# Patient Record
Sex: Male | Born: 1965
Health system: Southern US, Community
[De-identification: ages and names within clinical notes are randomized; demographics above are authoritative.]

## PROBLEM LIST (undated history)

## (undated) DIAGNOSIS — E1159 Type 2 diabetes mellitus with other circulatory complications: Secondary | ICD-10-CM

## (undated) DIAGNOSIS — N200 Calculus of kidney: Secondary | ICD-10-CM

## (undated) DIAGNOSIS — I152 Hypertension secondary to endocrine disorders: Secondary | ICD-10-CM

## (undated) HISTORY — DX: Hypertension secondary to endocrine disorders: I15.2

## (undated) HISTORY — DX: Type 2 diabetes mellitus with other circulatory complications: E11.59

---

## 1996-04-22 HISTORY — PX: OTHER SURGICAL HISTORY: SHX169

## 2008-03-29 ENCOUNTER — Emergency Department (HOSPITAL_COMMUNITY): Admission: EM | Admit: 2008-03-29 | Discharge: 2008-03-29 | Payer: Self-pay | Admitting: Emergency Medicine

## 2008-03-31 ENCOUNTER — Emergency Department (HOSPITAL_COMMUNITY): Admission: EM | Admit: 2008-03-31 | Discharge: 2008-03-31 | Payer: Self-pay | Admitting: Emergency Medicine

## 2010-02-02 IMAGING — CT CT NECK W/ CM
1 series · 12 of 14 positions shown, 15 images · IV contrast (omnipaque)
Comparison: None available.

CLINICAL DATA: Swollen tongue.  Ludwig's angina.

CT NECK WITH CONTRAST
TECHNIQUE: Multidetector CT imaging of the neck was performed with
intravenous contrast.
Contrast: 100 ml Omnipaque 300

[Series 2: neck_routine 3.0 b40s st · axial · 0.39mm/px · z∈[-323,-71]mm · 12 of 100 slices shown, 15 images]
[im 8/100  soft-tissue]
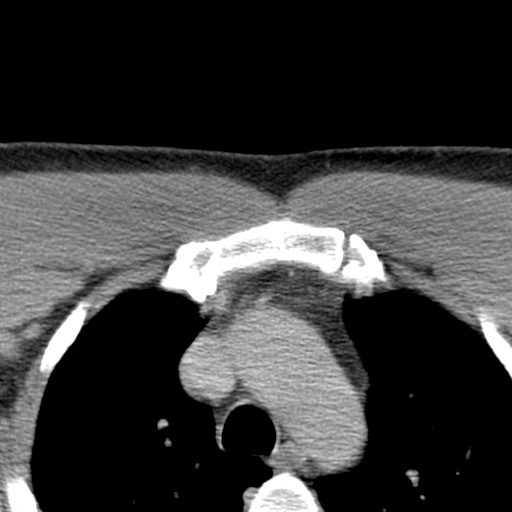
[im 8/100  bone]
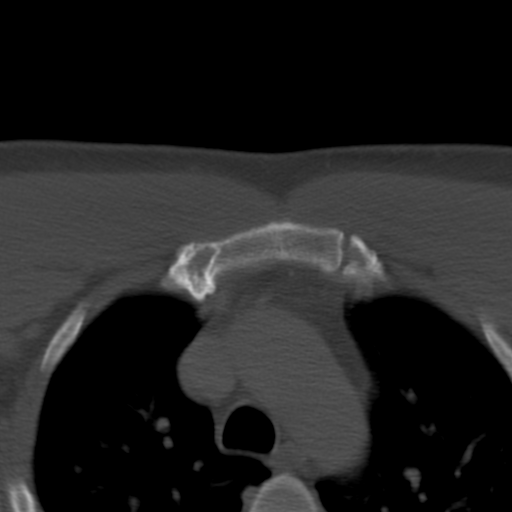
[im 16/100  bone]
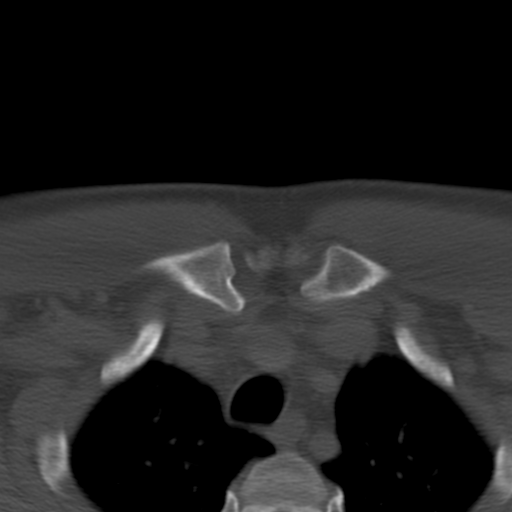
[im 23/100  bone]
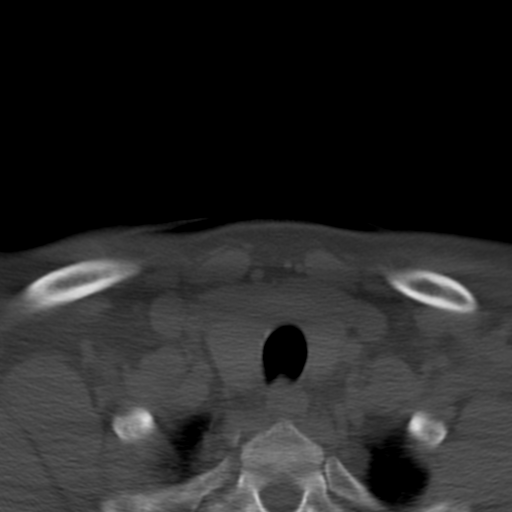
[im 31/100  bone]
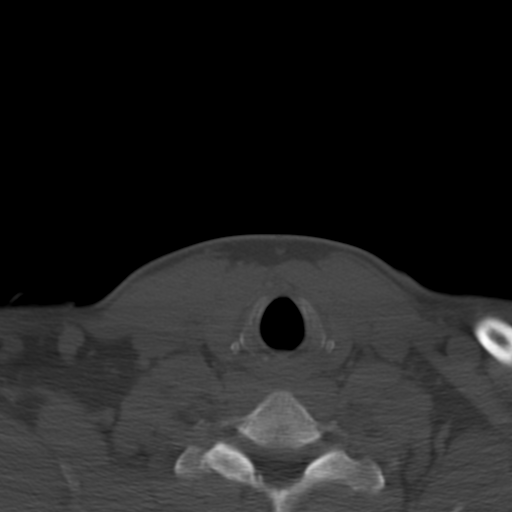
[im 39/100  soft-tissue]
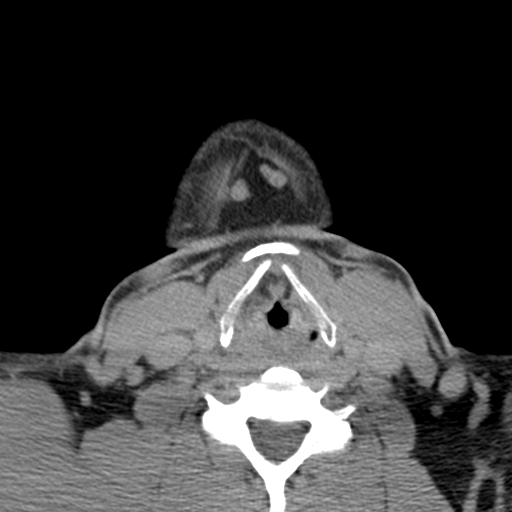
[im 39/100  bone]
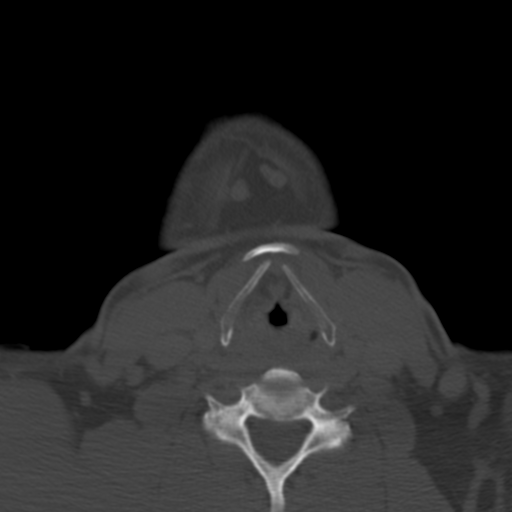
[im 46/100  bone]
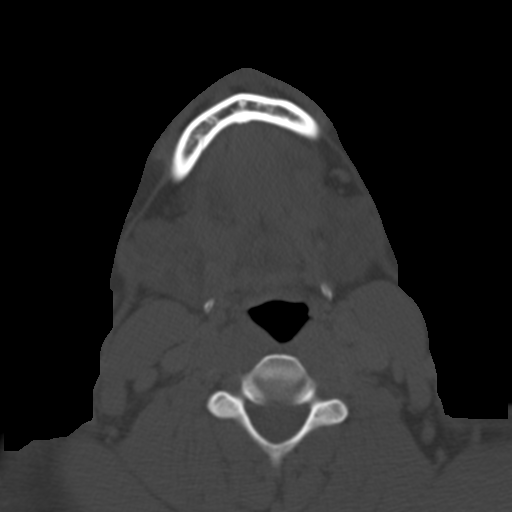
[im 54/100  bone]
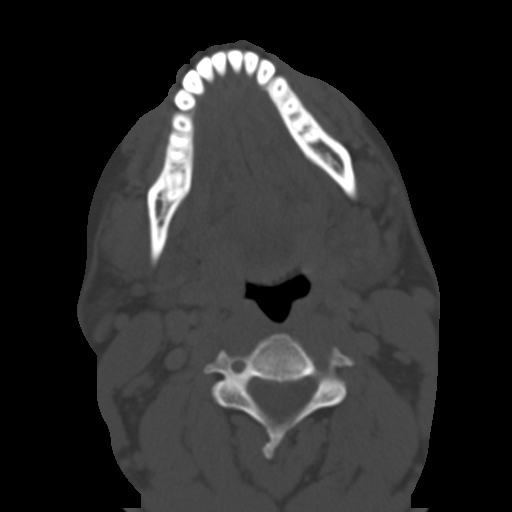
[im 61/100  bone]
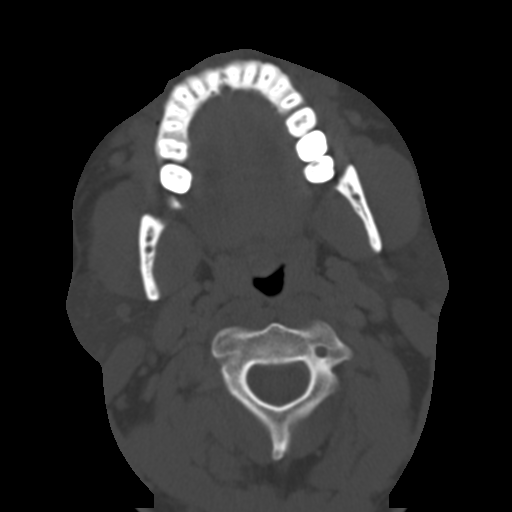
[im 69/100  soft-tissue]
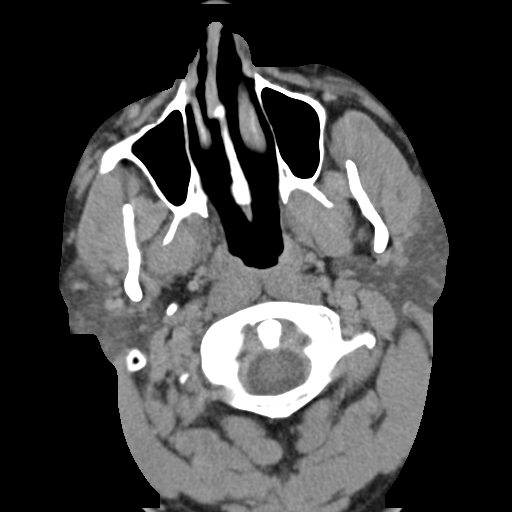
[im 69/100  bone]
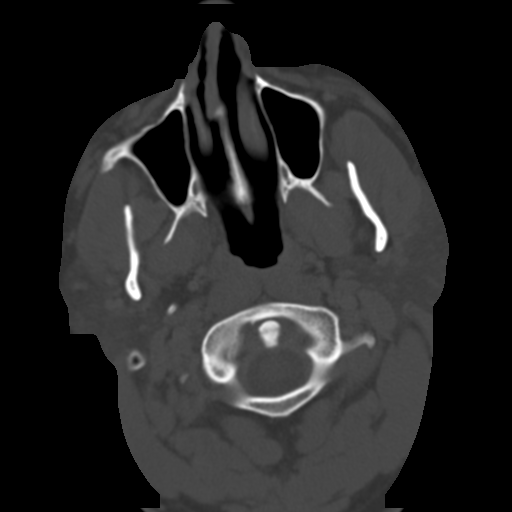
[im 77/100  bone]
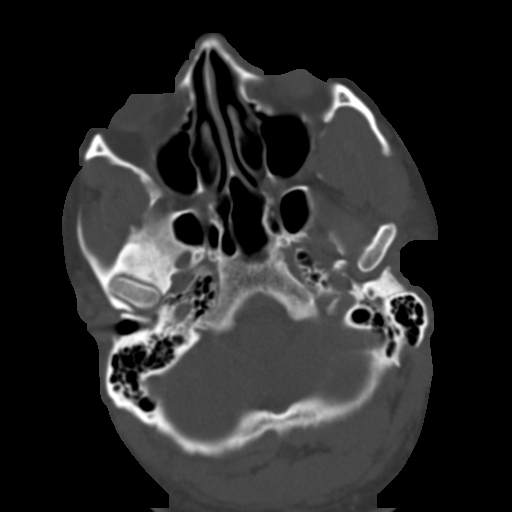
[im 84/100  bone]
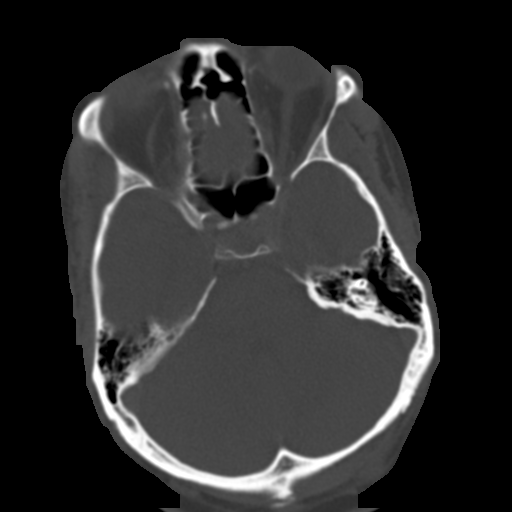
[im 92/100  bone]
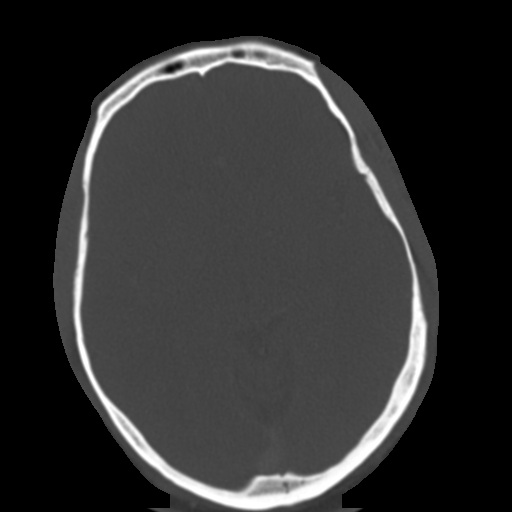

[12 of 14 positions shown; findings below may reference images not displayed]

FINDINGS: Limited imaging of the brain is unremarkable.  There is
no intrinsic tongue mass.  The contrast bolus is significantly
dilated due to the patient vomiting during the exam.  There is some
prominence of the geniohyoid muscles bilaterally.  Sub centimeter
submental nodes are present.  There are scattered bilateral sub
centimeter cervical nodes.  There is no pathologic enlargement.
The soft tissues are otherwise unremarkable.  The the visualized
paranasal sinuses and mastoid air cells are clear.  The osseous
structures are intact.  The lung apices are clear.
IMPRESSION: 1.  No intrinsic tongue mass.
2.  Mild prominence of the geniohyoid muscles may be inflammatory.
3.  Scattered small cervical lymph nodes bilaterally.

## 2010-09-04 NOTE — Consult Note (Signed)
NAME:  Alejandro Zimmerman, Alejandro Zimmerman                  ACCOUNT NO.:  [id number]   MEDICAL RECORD NO.:  [id number]          PATIENT TYPE:  EMS   LOCATION:  ED                           FACILITY:  [facility]   PHYSICIAN:  Kristine Garbe. Ezzard Standing, M.D.DATE OF BIRTH:  [birth year]   DATE OF CONSULTATION:  DATE OF DISCHARGE:  03/29/2008                                 CONSULTATION   REASON FOR CONSULTATION:  Evaluate the patient with floor of mouth  swelling, questionable Ludwig angina.   BRIEF HISTORY:  Alejandro Zimmerman is a 45 year old black male who has had some  swelling of the floor of the mouth, pain for the last 2 days.  Today, it  has gotten a little bit worse.  He had swelling of his tongue and  presented to [facility].  He was treated with a course  of IV antibiotics and steroids and got a CT scan.  The CT scan showed no  abscess and minimal swelling.  On examination, he has some swelling on  the floor of the mouth over the area of the submandibular duct, the  right side much worse than the left.  It is painful to palpation, it is  consistent with obstruction and inflammation of right submandibular  duct.  There is really no significant swelling over the gland,  submandibular gland itself.  There is no significant tongue swelling.  Airway is normal.   IMPRESSION:  Floor of mouth cellulitis, inflammation of right  submandibular duct.   RECOMMENDATIONS:  Recommend antibiotic, either Keflex or Augmentin for  the next week, will notify office if the symptoms worsen.           ______________________________  Kristine Garbe Ezzard Standing, M.D.     CEN/MEDQ  D:  03/29/2008  T:  03/30/2008  Job:  [id number]

## 2011-01-25 LAB — DIFFERENTIAL
Basophils Absolute: 0 10*3/uL (ref 0.0–0.1)
Eosinophils Relative: 1 % (ref 0–5)
Eosinophils Relative: 1 % (ref 0–5)
Lymphocytes Relative: 13 % (ref 12–46)
Lymphocytes Relative: 14 % (ref 12–46)
Lymphs Abs: 1.9 10*3/uL (ref 0.7–4.0)
Neutro Abs: 11.6 10*3/uL — ABNORMAL HIGH (ref 1.7–7.7)

## 2011-01-25 LAB — POCT I-STAT, CHEM 8
BUN: 8 mg/dL (ref 6–23)
Chloride: 101 mEq/L (ref 96–112)
Creatinine, Ser: 1.2 mg/dL (ref 0.4–1.5)
Sodium: 140 mEq/L (ref 135–145)
TCO2: 28 mmol/L (ref 0–100)

## 2011-01-25 LAB — CBC
HCT: 51.6 % (ref 39.0–52.0)
Hemoglobin: 17 g/dL (ref 13.0–17.0)
Platelets: 192 10*3/uL (ref 150–400)
Platelets: 203 10*3/uL (ref 150–400)
RDW: 13 % (ref 11.5–15.5)
WBC: 15.2 10*3/uL — ABNORMAL HIGH (ref 4.0–10.5)

## 2014-02-02 ENCOUNTER — Ambulatory Visit (INDEPENDENT_AMBULATORY_CARE_PROVIDER_SITE_OTHER): Payer: Self-pay | Admitting: Family Medicine

## 2014-02-02 VITALS — BP 138/84 | HR 70 | Temp 97.8°F | Resp 16 | Ht 72.5 in | Wt 261.0 lb

## 2014-02-02 DIAGNOSIS — E669 Obesity, unspecified: Secondary | ICD-10-CM

## 2014-02-02 DIAGNOSIS — Z0000 Encounter for general adult medical examination without abnormal findings: Secondary | ICD-10-CM | POA: Insufficient documentation

## 2014-02-02 DIAGNOSIS — Z024 Encounter for examination for driving license: Secondary | ICD-10-CM

## 2014-02-02 DIAGNOSIS — Z021 Encounter for pre-employment examination: Secondary | ICD-10-CM

## 2014-02-02 DIAGNOSIS — Z13220 Encounter for screening for lipoid disorders: Secondary | ICD-10-CM | POA: Insufficient documentation

## 2014-02-02 HISTORY — DX: Obesity, unspecified: E66.9

## 2014-02-02 NOTE — Progress Notes (Signed)
   Subjective:    Patient ID: Alejandro Zimmerman, male    DOB: 08/16/1965, 48 y.o.   MRN: [id number]  HPI  This 48 y.o. AA male is here for Self-pay DOT exam. He has no PCP but plans to get established with one within next 6 months. He has no hx of chronic medical problems. He considers himself to be in good health except for being overweight.  Surgical Hx: Hernia surgery.   Review of Systems  Constitutional: Negative.   HENT: Negative.   Eyes: Negative.        Wears corrective lenses.  Respiratory: Negative.   Cardiovascular: Negative.   Gastrointestinal: Negative.   Endocrine: Negative.   Genitourinary: Negative.   Musculoskeletal: Negative.   Skin: Negative.   Neurological: Negative.   Hematological: Negative.   Psychiatric/Behavioral: Negative.        Objective:   Physical Exam  Nursing note and vitals reviewed. Constitutional: He is oriented to person, place, and time. He appears well-developed and well-nourished. No distress.  HENT:  Head: Normocephalic and atraumatic.  Right Ear: Hearing, tympanic membrane, external ear and ear canal normal.  Left Ear: Hearing, tympanic membrane, external ear and ear canal normal.  Nose: Nose normal. No nasal deformity or septal deviation.  Mouth/Throat: Uvula is midline, oropharynx is clear and moist and mucous membranes are normal. No oral lesions. Normal dentition.  Eyes: Conjunctivae, EOM and lids are normal. Pupils are equal, round, and reactive to light. No scleral icterus.  Neck: Trachea normal, normal range of motion, full passive range of motion without pain and phonation normal. Neck supple. No spinous process tenderness and no muscular tenderness present. No mass and no thyromegaly present.  Cardiovascular: Normal rate, regular rhythm, S1 normal, S2 normal, normal heart sounds and normal pulses.   No extrasystoles are present. PMI is not displaced.  Exam reveals no gallop and no friction rub.   No murmur heard. Pulmonary/Chest:  Effort normal and breath sounds normal. No respiratory distress.  Abdominal: Soft. Normal appearance and bowel sounds are normal. He exhibits no distension, no pulsatile midline mass and no mass. There is no hepatosplenomegaly. There is no tenderness. There is no guarding and no CVA tenderness.  Musculoskeletal: Normal range of motion. He exhibits no edema and no tenderness.       Cervical back: Normal.       Thoracic back: Normal.       Lumbar back: Normal.  Remainder of exam unremarkable.  Lymphadenopathy:    He has no cervical adenopathy.  Neurological: He is alert and oriented to person, place, and time. He has normal strength and normal reflexes. He displays no atrophy. No cranial nerve deficit or sensory deficit. He exhibits normal muscle tone. He displays a negative Romberg sign. Coordination and gait normal.  Skin: Skin is warm and dry. He is not diaphoretic.  Psychiatric: He has a normal mood and affect. His behavior is normal. Judgment and thought content normal.       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  Advised to get established with PCP for ongoing healthcare maintenance.

## 2014-12-04 HISTORY — PX: OTHER SURGICAL HISTORY: SHX169

## 2014-12-12 ENCOUNTER — Encounter (HOSPITAL_COMMUNITY): Payer: Self-pay | Admitting: General Practice

## 2014-12-13 NOTE — Progress Notes (Signed)
Called Dr Cindee Salt office for orders for lithotripsy.

## 2014-12-15 ENCOUNTER — Ambulatory Visit (HOSPITAL_COMMUNITY): Admission: RE | Admit: 2014-12-15 | Payer: Self-pay | Source: Ambulatory Visit | Admitting: Urology

## 2014-12-15 HISTORY — DX: Calculus of kidney: N20.0

## 2014-12-15 SURGERY — LITHOTRIPSY, ESWL
Anesthesia: LOCAL | Laterality: Right

## 2016-01-25 ENCOUNTER — Ambulatory Visit (INDEPENDENT_AMBULATORY_CARE_PROVIDER_SITE_OTHER): Payer: Self-pay | Admitting: Physician Assistant

## 2016-01-25 VITALS — BP 134/84 | HR 74 | Temp 98.2°F | Resp 17 | Ht 72.0 in | Wt 250.0 lb

## 2016-01-25 DIAGNOSIS — Z0289 Encounter for other administrative examinations: Secondary | ICD-10-CM

## 2016-01-25 NOTE — Progress Notes (Signed)
Airline pilot Medical Examination   Alejandro Zimmerman is a 50 y.o. male who presents today for a commercial driver fitness determination physical exam. The patient reports no problems today. In the past the patient reports receiving 2 year certificates. He denies focal neurological deficits, vision and hearing changes. He denies the habitual use of benzodiazepines and chronic narcotic usage.   Patient has a history of kidney stones which he had surgery for last year. He states that he did not have any kidney stones prior.  Patient takes Flomax nightly and has a urologist who he regularly follows up with.  He denies any weakness with his hands/feet or issues with his grip strength. Patient does not take any medication for anxiety or depression.  He does not take any medications besides what is given to him.  He has been losing weight recently and drinks "protein shakes" every morning. Patient has had surgery for a hiatal hernia in the past.     Past Medical History:  Diagnosis Date  . Kidney stones     Current medications, family history, allergies, social history reviewed by me and exist elsewhere in the encounter.   Review of Systems  All other systems reviewed and are negative.   Objective:     Vision/hearing:  Visual Acuity Screening   Right eye Left eye Both eyes  Without correction:     With correction: 20/25 20/25 20/30   Hearing Screening Comments: Whisper test was 10 ft in both eyes.   Applicant can recognize and distinguish among traffic control signals and devices showing standard red, green, and amber colors.  Corrective lenses required: Yes  Monocular Vision?: No  Hearing aid requirement: No  Physical Exam  Constitutional: He is oriented to person, place, and time. He appears well-developed. No distress.  HENT:  Right Ear: Tympanic membrane normal.  Left Ear: Tympanic membrane normal.  Mouth/Throat: Oropharynx is clear and moist.  Neck:  No lymphadenopathy about  the head and neck.   Cardiovascular: Normal rate, regular rhythm and normal heart sounds.   No murmur heard. Pulmonary/Chest: Effort normal and breath sounds normal. No respiratory distress. He has no wheezes. He has no rales.  Lungs clear to auscultation bilaterally.   Abdominal: Soft. There is no tenderness.  Abdomen without masses.   Musculoskeletal: Normal range of motion. He exhibits no edema.  Neurological: He is oriented to person, place, and time. He displays normal reflexes. No cranial nerve deficit. He exhibits normal muscle tone. Coordination normal.  Reflexes 2+ globally.     BP 134/84   Pulse 74   Temp 98.2 F (36.8 C) (Oral)   Resp 17   Ht 6' (1.829 m)   Wt 250 lb (113.4 kg)   SpO2 98%   BMI 33.91 kg/m    Labs:  Specific gravity 1.030  He was positive for protein in his urine, negative for blood, negative for sugar.   Assessment:    Healthy male exam.  Meets standards in 66 CFR 391.41;  qualifies for 2 year certificate.  Of note he has protein in his urine, however this is not exclusionary criteria and he has a urologist with regular follow up given history of nephrolithiasis. Additionally, he has been taking a short course of tramadol for knee pain.  I have advised this medication is exclusionary and he wants to bring the remainder of his tabs to [facility] today and I will dispose of them.  He has no refills.    Plan:  Medical examiners certificate completed and printed. Return as needed.     This note was scribed in my presence and I performed the services described in the this documentation.

## 2016-01-25 NOTE — Patient Instructions (Signed)
     IF you received an x-ray today, you will receive an invoice from [facility]. Please contact [facility] at [phone] with questions or concerns regarding your invoice.   IF you received labwork today, you will receive an invoice from [facility]. Please contact [facility] at [phone] with questions or concerns regarding your invoice.   Our billing staff will not be able to assist you with questions regarding bills from these companies.  You will be contacted with the lab results as soon as they are available. The fastest way to get your results is to activate your My Chart account. Instructions are located on the last page of this paperwork. If you have not heard from us regarding the results in 2 weeks, please contact this office.      

## 2016-02-22 ENCOUNTER — Ambulatory Visit (INDEPENDENT_AMBULATORY_CARE_PROVIDER_SITE_OTHER): Payer: BLUE CROSS/BLUE SHIELD | Admitting: Physician Assistant

## 2016-02-22 VITALS — BP 140/96 | HR 87 | Temp 98.4°F | Resp 17 | Ht 72.0 in | Wt 250.0 lb

## 2016-02-22 DIAGNOSIS — H6690 Otitis media, unspecified, unspecified ear: Secondary | ICD-10-CM | POA: Diagnosis not present

## 2016-02-22 MED ORDER — AMOXICILLIN-POT CLAVULANATE 875-125 MG PO TABS: 1.0000 | ORAL_TABLET | Freq: Two times a day (BID) | ORAL | 0 refills | Status: DC

## 2016-02-22 NOTE — Progress Notes (Signed)
   Alejandro AlbeeRandy L Zimmerman  MRN: [id number] DOB: 12/09/1965  PCP: No PCP Per Patient  Subjective:  Pt is a 50 year old male who presents to clinic for illness x five days.  His symptoms began with headaches, and progressed to runny nose and watery eyes. Headache has continued with pressure at the front of his head, also complains of dizziness and feeling off balance.  Notes he doesn't feel like doing much, sleeps most of the day.  Tried netti pot, helped some. Tried allergy pills, helped some.  Drinks a gallon of water a day.  Has not had flu shot.   Review of Systems  Constitutional: Positive for fatigue. Negative for activity change, chills, diaphoresis and fever.  HENT: Positive for congestion, rhinorrhea and sneezing. Negative for sore throat and tinnitus.   Respiratory: Positive for cough. Negative for chest tightness, shortness of breath and wheezing.   Cardiovascular: Negative for chest pain and palpitations.  Gastrointestinal: Negative for abdominal pain, diarrhea, nausea and vomiting.  Neurological: Positive for dizziness and headaches. Negative for syncope, weakness and light-headedness.    Patient Active Problem List   Diagnosis Date Noted  . Obesity 02/02/2014    Current Outpatient Prescriptions on File Prior to Visit  Medication Sig Dispense Refill  . Multiple Vitamins-Minerals (MULTIVITAMIN WITH MINERALS) tablet Take 1 tablet by mouth daily.    . tamsulosin (FLOMAX) 0.4 MG CAPS capsule Take 0.4 mg by mouth daily after supper.     No current facility-administered medications on file prior to visit.     Allergies  Allergen Reactions  . Ibuprofen Other (See Comments)    Makes kidneys hurt.    Objective:  BP (!) 140/96 (BP Location: Left Arm, Patient Position: Sitting, Cuff Size: Large)   Pulse 87   Temp 98.4 F (36.9 C) (Oral)   Resp 17   Ht 6' (1.829 m)   Wt 250 lb (113.4 kg)   SpO2 98%   BMI 33.91 kg/m   Physical Exam  Constitutional: He is oriented to person,  place, and time and well-developed, well-nourished, and in no distress. No distress.  HENT:  Right Ear: Tympanic membrane is erythematous and bulging.  Left Ear: Tympanic membrane is bulging.  Mouth/Throat: Oropharynx is clear and moist and mucous membranes are normal.  Cardiovascular: Normal rate, regular rhythm and normal heart sounds.   Pulmonary/Chest: Effort normal and breath sounds normal. No respiratory distress.  Neurological: He is alert and oriented to person, place, and time. GCS score is 15.  Skin: Skin is warm and dry.  Psychiatric: Mood, memory, affect and judgment normal.  Vitals reviewed.   Assessment and Plan :  1. Acute otitis media, unspecified otitis media type - amoxicillin-clavulanate (AUGMENTIN) 875-125 MG tablet; Take 1 tablet by mouth 2 (two) times daily.  Dispense: 20 tablet; Refill: 0 - Supportive care discussed with patient. Get plenty of rest and drink fluids. RTC if symptoms do not improve/worsen in 5-7 days.    Marco CollieWhitney Zigmund Linse, PA-C  [facility] 02/22/2016 2:32 PM

## 2016-02-22 NOTE — Patient Instructions (Addendum)
Please fill this prescription at your pharmacy. You can take ibuprofen for pain/headache. Please continue drinking plenty of fluids and get plenty of rest.  Return to clinic if you are not better in 7-10 days.   Thank you for coming in today. I hope you feel we met your needs.  Feel free to call [facility] if you have any questions or further requests.  Please consider signing up for MyChart if you do not already have it, as this is a great way to communicate with me.  Best,  Whitney McVey, PA-C   IF you received an x-ray today, you will receive an invoice from [facility]. Please contact [facility] at [phone] with questions or concerns regarding your invoice.   IF you received labwork today, you will receive an invoice from [employer]. Please contact [facility] at [phone] with questions or concerns regarding your invoice.   Our billing staff will not be able to assist you with questions regarding bills from these companies.  You will be contacted with the lab results as soon as they are available. The fastest way to get your results is to activate your My Chart account. Instructions are located on the last page of this paperwork. If you have not heard from [place] regarding the results in 2 weeks, please contact this office.      Otitis Media, Adult Otitis media is redness, soreness, and inflammation of the middle ear. Otitis media may be caused by allergies or, most commonly, by infection. Often it occurs as a complication of the common cold. SIGNS AND SYMPTOMS Symptoms of otitis media may include:  Earache.  Fever.  Ringing in your ear.  Headache.  Leakage of fluid from the ear. DIAGNOSIS To diagnose otitis media, your health care provider will examine your ear with an otoscope. This is an instrument that allows your health care provider to see into your ear in order to examine your eardrum. Your health care provider also will ask you  questions about your symptoms. TREATMENT  Typically, otitis media resolves on its own within 3-5 days. Your health care provider may prescribe medicine to ease your symptoms of pain. If otitis media does not resolve within 5 days or is recurrent, your health care provider may prescribe antibiotic medicines if he or she suspects that a bacterial infection is the cause. HOME CARE INSTRUCTIONS   If you were prescribed an antibiotic medicine, finish it all even if you start to feel better.  Take medicines only as directed by your health care provider.  Keep all follow-up visits as directed by your health care provider. SEEK MEDICAL CARE IF:  You have otitis media only in one ear, or bleeding from your nose, or both.  You notice a lump on your neck.  You are not getting better in 3-5 days.  You feel worse instead of better. SEEK IMMEDIATE MEDICAL CARE IF:   You have pain that is not controlled with medicine.  You have swelling, redness, or pain around your ear or stiffness in your neck.  You notice that part of your face is paralyzed.  You notice that the bone behind your ear (mastoid) is tender when you touch it. MAKE SURE YOU:   Understand these instructions.  Will watch your condition.  Will get help right away if you are not doing well or get worse.   This information is not intended to replace advice given to you by your health care provider. Make sure you discuss  any questions you have with your health care provider.   Document Released: 01/12/2004 Document Revised: 04/29/2014 Document Reviewed: 11/03/2012 Elsevier Interactive Patient Education [employer].

## 2018-02-13 LAB — HM COLONOSCOPY

## 2020-03-06 ENCOUNTER — Other Ambulatory Visit: Payer: Self-pay

## 2020-03-06 ENCOUNTER — Ambulatory Visit: Payer: BC Managed Care – PPO | Admitting: Legal Medicine

## 2020-03-06 ENCOUNTER — Encounter: Payer: Self-pay | Admitting: Legal Medicine

## 2020-03-06 VITALS — BP 150/100 | HR 87 | Temp 98.0°F | Resp 16 | Ht 73.0 in | Wt 248.6 lb

## 2020-03-06 DIAGNOSIS — E1159 Type 2 diabetes mellitus with other circulatory complications: Secondary | ICD-10-CM | POA: Diagnosis not present

## 2020-03-06 DIAGNOSIS — R072 Precordial pain: Secondary | ICD-10-CM | POA: Diagnosis not present

## 2020-03-06 DIAGNOSIS — I152 Hypertension secondary to endocrine disorders: Secondary | ICD-10-CM | POA: Diagnosis not present

## 2020-03-06 DIAGNOSIS — R739 Hyperglycemia, unspecified: Secondary | ICD-10-CM

## 2020-03-06 DIAGNOSIS — Z13220 Encounter for screening for lipoid disorders: Secondary | ICD-10-CM | POA: Diagnosis not present

## 2020-03-06 DIAGNOSIS — R631 Polydipsia: Secondary | ICD-10-CM

## 2020-03-06 HISTORY — DX: Hyperglycemia, unspecified: R73.9

## 2020-03-06 LAB — POCT URINALYSIS DIP (CLINITEK)
Bilirubin, UA: NEGATIVE
Blood, UA: NEGATIVE
Glucose, UA: >=1000 mg/dL — AB
Leukocytes, UA: NEGATIVE
Nitrite, UA: NEGATIVE
Spec Grav, UA: 1.02 (ref 1.010–1.025)
Urobilinogen, UA: 0.2 E.U./dL
pH, UA: 6 (ref 5.0–8.0)

## 2020-03-06 LAB — POCT UA - MICROALBUMIN: Microalbumin Ur, POC: 80 mg/L

## 2020-03-06 LAB — GLUCOSE, POCT (MANUAL RESULT ENTRY): POC Glucose: 315 mg/dl — AB (ref 70–99)

## 2020-03-06 MED ORDER — LISINOPRIL 20 MG PO TABS: 20.0000 mg | ORAL_TABLET | Freq: Every day | ORAL | 3 refills | Status: DC

## 2020-03-06 MED ORDER — ATORVASTATIN CALCIUM 40 MG PO TABS: 40.0000 mg | ORAL_TABLET | Freq: Every day | ORAL | 3 refills | Status: DC

## 2020-03-06 MED ORDER — METFORMIN HCL 500 MG PO TABS: 500.0000 mg | ORAL_TABLET | Freq: Two times a day (BID) | ORAL | 3 refills | Status: DC

## 2020-03-06 NOTE — Patient Instructions (Signed)
Carpal Tunnel Syndrome  Carpal tunnel syndrome is a condition that causes pain in your hand and arm. The carpal tunnel is a narrow area that is on the palm side of your wrist. Repeated wrist motion or certain diseases may cause swelling in the tunnel. This swelling can pinch the main nerve in the wrist (median nerve). What are the causes? This condition may be caused by:  Repeated wrist motions.  Wrist injuries.  Arthritis.  A sac of fluid (cyst) or abnormal growth (tumor) in the carpal tunnel.  Fluid buildup during pregnancy. Sometimes the cause is not known. What increases the risk? The following factors may make you more likely to develop this condition:  Having a job in which you move your wrist in the same way many times. This includes jobs like being a butcher or a cashier.  Being a woman.  Having other health conditions, such as: ? Diabetes. ? Obesity. ? A thyroid gland that is not active enough (hypothyroidism). ? Kidney failure. What are the signs or symptoms? Symptoms of this condition include:  A tingling feeling in your fingers.  Tingling or a loss of feeling (numbness) in your hand.  Pain in your entire arm. This pain may get worse when you bend your wrist and elbow for a [place] time.  Pain in your wrist that goes up your arm to your shoulder.  Pain that goes down into your palm or fingers.  A weak feeling in your hands. You may find it hard to grab and hold items. You may feel worse at night. How is this diagnosed? This condition is diagnosed with a medical history and physical exam. You may also have tests, such as:  Electromyogram (EMG). This test checks the signals that the nerves send to the muscles.  Nerve conduction study. This test checks how well signals pass through your nerves.  Imaging tests, such as X-rays, ultrasound, and MRI. These tests check for what might be the cause of your condition. How is this treated? This condition may be treated  with:  Lifestyle changes. You will be asked to stop or change the activity that caused your problem.  Doing exercise and activities that make bones and muscles stronger (physical therapy).  Learning how to use your hand again (occupational therapy).  Medicines for pain and swelling (inflammation). You may have injections in your wrist.  A wrist splint.  Surgery. Follow these instructions at home: If you have a splint:  Wear the splint as told by your doctor. Remove it only as told by your doctor.  Loosen the splint if your fingers: ? Tingle. ? Lose feeling (become numb). ? Turn cold and blue.  Keep the splint clean.  If the splint is not waterproof: ? Do not let it get wet. ? Cover it with a watertight covering when you take a bath or a shower. Managing pain, stiffness, and swelling   If told, put ice on the painful area: ? If you have a removable splint, remove it as told by your doctor. ? Put ice in a plastic bag. ? Place a towel between your skin and the bag. ? Leave the ice on for 20 minutes, 2-3 times per day. General instructions  Take over-the-counter and prescription medicines only as told by your doctor.  Rest your wrist from any activity that may cause pain. If needed, talk with your boss at work about changes that can help your wrist heal.  Do any exercises as told by your doctor,   physical therapist, or occupational therapist.  Keep all follow-up visits as told by your doctor. This is important. Contact a doctor if:  You have new symptoms.  Medicine does not help your pain.  Your symptoms get worse. Get help right away if:  You have very bad numbness or tingling in your wrist or hand. Summary  Carpal tunnel syndrome is a condition that causes pain in your hand and arm.  It is often caused by repeated wrist motions.  Lifestyle changes and medicines are used to treat this problem. Surgery may help in very bad cases.  Follow your doctor's  instructions about wearing a splint, resting your wrist, keeping follow-up visits, and calling for help. This information is not intended to replace advice given to you by your health care provider. Make sure you discuss any questions you have with your health care provider. Document Revised: 08/15/2017 Document Reviewed: 08/15/2017 Elsevier Patient Education  2020 [employer] Inc[employer] Diabetes Basics  Diabetes (diabetes mellitus) is a [place]-term (chronic) disease. It occurs when the body does not properly use sugar (glucose) that is released from food after you eat. Diabetes may be caused by one or both of these problems:  Your pancreas does not make enough of a hormone called insulin.  Your body does not react in a normal way to insulin that it makes. Insulin lets sugars (glucose) go into cells in your body. This gives you energy. If you have diabetes, sugars cannot get into cells. This causes high blood sugar (hyperglycemia). Follow these instructions at home: How is diabetes treated? You may need to take insulin or other diabetes medicines daily to keep your blood sugar in balance. Take your diabetes medicines every day as told by your doctor. List your diabetes medicines here: Diabetes medicines  Name of medicine: ______________________________ ? Amount (dose): _______________ Time (a.m./p.m.): _______________ Notes: ___________________________________  Name of medicine: ______________________________ ? Amount (dose): _______________ Time (a.m./p.m.): _______________ Notes: ___________________________________  Name of medicine: ______________________________ ? Amount (dose): _______________ Time (a.m./p.m.): _______________ Notes: ___________________________________ If you use insulin, you will learn how to give yourself insulin by injection. You may need to adjust the amount based on the food that you eat. List the types of insulin you use here: Insulin  Insulin type:  ______________________________ ? Amount (dose): _______________ Time (a.m./p.m.): _______________ Notes: ___________________________________  Insulin type: ______________________________ ? Amount (dose): _______________ Time (a.m./p.m.): _______________ Notes: ___________________________________  Insulin type: ______________________________ ? Amount (dose): _______________ Time (a.m./p.m.): _______________ Notes: ___________________________________  Insulin type: ______________________________ ? Amount (dose): _______________ Time (a.m./p.m.): _______________ Notes: ___________________________________  Insulin type: ______________________________ ? Amount (dose): _______________ Time (a.m./p.m.): _______________ Notes: ___________________________________ How do I manage my blood sugar?  Check your blood sugar levels using a blood glucose monitor as directed by your doctor. Your doctor will set treatment goals for you. Generally, you should have these blood sugar levels:  Before meals (preprandial): 80-130 mg/dL (3.2-2.0 mmol/L).  After meals (postprandial): below 180 mg/dL (10 mmol/L).  A1c level: less than 7%. Write down the times that you will check your blood sugar levels: Blood sugar checks  Time: _______________ Notes: ___________________________________  Time: _______________ Notes: ___________________________________  Time: _______________ Notes: ___________________________________  Time: _______________ Notes: ___________________________________  Time: _______________ Notes: ___________________________________  Time: _______________ Notes: ___________________________________  What do I need to know about low blood sugar? Low blood sugar is called hypoglycemia. This is when blood sugar is at or below 70 mg/dL (3.9 mmol/L). Symptoms may include:  Feeling: ? Hungry. ? Worried or nervous (anxious). ? Sweaty  and clammy. ? Confused. ? Dizzy. ? Sleepy. ? Sick to your  stomach (nauseous).  Having: ? A fast heartbeat. ? A headache. ? A change in your vision. ? Tingling or no feeling (numbness) around the mouth, lips, or tongue. ? Jerky movements that you cannot control (seizure).  Having trouble with: ? Moving (coordination). ? Sleeping. ? Passing out (fainting). ? Getting upset easily (irritability). Treating low blood sugar To treat low blood sugar, eat or drink something sugary right away. If you can think clearly and swallow safely, follow the 15:15 rule:  Take 15 grams of a fast-acting carb (carbohydrate). Talk with your doctor about how much you should take.  Some fast-acting carbs are: ? Sugar tablets (glucose pills). Take 3-4 glucose pills. ? 6-8 pieces of hard candy. ? 4-6 oz (120-150 mL) of fruit juice. ? 4-6 oz (120-150 mL) of regular (not diet) soda. ? 1 Tbsp (15 mL) honey or sugar.  Check your blood sugar 15 minutes after you take the carb.  If your blood sugar is still at or below 70 mg/dL (3.9 mmol/L), take 15 grams of a carb again.  If your blood sugar does not go above 70 mg/dL (3.9 mmol/L) after 3 tries, get help right away.  After your blood sugar goes back to normal, eat a meal or a snack within 1 hour. Treating very low blood sugar If your blood sugar is at or below 54 mg/dL (3 mmol/L), you have very low blood sugar (severe hypoglycemia). This is an emergency. Do not wait to see if the symptoms will go away. Get medical help right away. Call your local emergency services (911 in the [place] Do not drive yourself to the hospital. Questions to ask your health care provider  Do I need to meet with a diabetes educator?  What equipment will I need to care for myself at home?  What diabetes medicines do I need? When should I take them?  How often do I need to check my blood sugar?  What number can I call if I have questions?  When is my next doctor's visit?  Where can I find a support group for people with  diabetes? Where to find more information  American Diabetes Association: www.diabetes.org  American Association of Diabetes Educators: www.diabeteseducator.org/patient-resources Contact a doctor if:  Your blood sugar is at or above 240 mg/dL (96.7 mmol/L) for 2 days in a row.  You have been sick or have had a fever for 2 days or more, and you are not getting better.  You have any of these problems for more than 6 hours: ? You cannot eat or drink. ? You feel sick to your stomach (nauseous). ? You throw up (vomit). ? You have watery poop (diarrhea). Get help right away if:  Your blood sugar is lower than 54 mg/dL (3 mmol/L).  You get confused.  You have trouble: ? Thinking clearly. ? Breathing. Summary  Diabetes (diabetes mellitus) is a [place]-term (chronic) disease. It occurs when the body does not properly use sugar (glucose) that is released from food after digestion.  Take insulin and diabetes medicines as told.  Check your blood sugar every day, as often as told.  Keep all follow-up visits as told by your doctor. This is important. This information is not intended to replace advice given to you by your health care provider. Make sure you discuss any questions you have with your health care provider. Document Revised: 12/30/2018 Document Reviewed: 07/11/2017 Elsevier Patient Education  2020 Elsevier Inc.  

## 2020-03-06 NOTE — Progress Notes (Signed)
Subjective:  Patient ID: Alejandro Zimmerman, male    DOB: 03-10-1966  Age: 54 y.o. MRN: [id number]  Chief Complaint  Patient presents with  . Numbness    bilateral hands and feet    HPI: Chronic visit  He is having numbness in arms and legs for years.  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  His sugar is high in urine.   Blurred vision, nocturia x 4 to 5 times.  No diet.  BP high on DOT.  Numbness hands and feet.  Hands itch.  Numbness feet especial first 2 toes.  B remaining elevated.  It was up with DOT.  Start lisinopril. Patient has probably had some diabetes with neuropathy and uncontrolled hypertension for several years now yet was on medicine for his blood pressure but he stopped that before.  He has a strong family history for adult onset diabetes.  He is not on a diet over nor does he exercise.  He is usually driving in his truck.  We discussed diet with him extensively as well as the probable diagnosis of diabetes with some high blood pressure.  We will recheck his blood pressure in 2 weeks. Current Outpatient Medications on File Prior to Visit  Medication Sig Dispense Refill  . Multiple Vitamins-Minerals (MULTIVITAMIN WITH MINERALS) tablet Take 1 tablet by mouth daily.    . tamsulosin (FLOMAX) 0.4 MG CAPS capsule Take 0.4 mg by mouth daily after supper. (Patient not taking: Reported on 03/06/2020)     No current facility-administered medications on file prior to visit.   Past Medical History:  Diagnosis Date  . Kidney stones    Past Surgical History:  Procedure Laterality Date  . hiatal hernia  1998  . left stent with stone removal Left 12/04/2014   [facility]    History reviewed. No pertinent family history. Social History   Socioeconomic History  . Marital status: Divorced    Spouse name: Not on file  . Number of children: Not on file  . Years of education: Not on file  . Highest education level: Not on file  Occupational History  . Not on file  Tobacco Use  .  Smoking status: Never Smoker  . Smokeless tobacco: Never Used  Substance and Sexual Activity  . Alcohol use: Yes    Comment: socially   . Drug use: Never  . Sexual activity: Yes  Other Topics Concern  . Not on file  Social History Narrative  . Not on file   Social Determinants of Health   Financial Resource Strain:   . Difficulty of Paying Living Expenses: Not on file  Food Insecurity:   . Worried About Programme researcher, broadcasting/film/video in the Last Year: Not on file  . Ran Out of Food in the Last Year: Not on file  Transportation Needs:   . Lack of Transportation (Medical): Not on file  . Lack of Transportation (Non-Medical): Not on file  Physical Activity:   . Days of Exercise per Week: Not on file  . Minutes of Exercise per Session: Not on file  Stress:   . Feeling of Stress : Not on file  Social Connections:   . Frequency of Communication with Friends and Family: Not on file  . Frequency of Social Gatherings with Friends and Family: Not on file  . Attends Religious Services: Not on file  . Active Member of Clubs or Organizations: Not on file  . Attends Banker Meetings: Not on  file  . Marital Status: Not on file    Review of Systems  Constitutional: Negative for activity change and appetite change.  HENT: Negative.   Eyes: Positive for visual disturbance.  Respiratory: Negative for cough, choking and shortness of breath.   Cardiovascular: Negative for chest pain, palpitations and leg swelling.  Endocrine: Positive for polydipsia, polyphagia and polyuria.  Genitourinary: Positive for frequency.  Neurological: Positive for numbness (hand and feet.).  Hematological: Negative.      Objective:  BP (!) 150/100 (BP Location: Right Arm, Patient Position: Sitting, Cuff Size: Normal)   Pulse 87   Temp 98 F (36.7 C) (Temporal)   Resp 16   Ht 6\' 1"  (1.854 m)   Wt 248 lb 9.6 oz (112.8 kg)   SpO2 95%   BMI 32.80 kg/m   BP/Weight 03/06/2020 02/22/2016 01/25/2016    Systolic BP 150 140 134  Diastolic BP 100 96 84  Wt. (Lbs) 248.6 250 250  BMI 32.8 33.91 33.91    Physical Exam Constitutional:      Appearance: He is obese.  HENT:     Head: Normocephalic and atraumatic.     Right Ear: Tympanic membrane, ear canal and external ear normal.     Left Ear: Tympanic membrane, ear canal and external ear normal.     Nose: Nose normal.     Mouth/Throat:     Mouth: Mucous membranes are moist.     Pharynx: Oropharynx is clear.  Eyes:     Extraocular Movements: Extraocular movements intact.     Conjunctiva/sclera: Conjunctivae normal.     Pupils: Pupils are equal, round, and reactive to light.  Cardiovascular:     Rate and Rhythm: Normal rate and regular rhythm.     Pulses: Normal pulses.     Heart sounds: Normal heart sounds.  Pulmonary:     Effort: Pulmonary effort is normal.     Breath sounds: Normal breath sounds.  Abdominal:     General: Abdomen is flat. Bowel sounds are normal.     Palpations: Abdomen is soft.  Skin:    General: Skin is warm.     Capillary Refill: Capillary refill takes less than 2 seconds.  Neurological:     General: No focal deficit present.     Mental Status: He is oriented to person, place, and time.     Sensory: Sensory deficit (hands and feet) present.  Psychiatric:        Mood and Affect: Mood normal.        Behavior: Behavior normal.        Thought Content: Thought content normal.     Diabetic Foot Exam - Simple   Simple Foot Form Diabetic Foot exam was performed with the following findings: Yes 03/06/2020  9:42 AM  Visual Inspection No deformities, no ulcerations, no other skin breakdown bilaterally: Yes Sensation Testing See comments: Yes Pulse Check Posterior Tibialis and Dorsalis pulse intact bilaterally: Yes Comments Decrease sensation first 2 toes    EKG: NSR rate83, borderline 1st degree sa block 03/08/2020, QRS , QTC 402, axis -24, LAD, Q's in inferior leads, may be septal.  Lab Results   Component Value Date   WBC 15.2 (H) 03/31/2008   HGB 18.4 (H) 03/31/2008   HCT 54.0 (H) 03/31/2008   PLT 203 03/31/2008   GLUCOSE 104 (H) 03/31/2008   NA 140 03/31/2008   K 3.5 03/31/2008   CL 101 03/31/2008   CREATININE 1.2 03/31/2008   BUN 8 03/31/2008  MICROALBUR 80 03/06/2020      Assessment & Plan:   1. Polydipsia - Glucose (CBG) Patient has had polydipsia and polyuria for at least last 3 months suggesting that his diabetes was worse during that time.  Urinalysis shows no infection.  2. Hyperglycemia - Hemoglobin A1c - metFORMIN (GLUCOPHAGE) 500 MG tablet; Take 1 tablet (500 mg total) by mouth 2 (two) times daily with a meal.  Dispense: 180 tablet; Refill: 3 - atorvastatin (LIPITOR) 40 MG tablet; Take 1 tablet (40 mg total) by mouth daily.  Dispense: 90 tablet; Refill: 3 Patient's been known to have hyperglycemia and was over 300 today.  Is also had glycosuria on his DOT physical.  We will check an A1c but will start him on Metformin 500 mg twice a day.  My nurse spent at least 20 minutes discussing diabetes and diabetic diet to start him out.  I also gave him a DASH diet to help him.  We discussed that this is only the beginning and we will have to continue to discuss these things with him to try to get him under better control.  3. Hypertension associated with diabetes (HCC) - CBC with Differential/Platelet - Comprehensive metabolic panel - POCT UA - Microalbumin - POCT URINALYSIS DIP (CLINITEK) - lisinopril (ZESTRIL) 20 MG tablet; Take 1 tablet (20 mg total) by mouth daily.  Dispense: 90 tablet; Refill: 3 - atorvastatin (LIPITOR) 40 MG tablet; Take 1 tablet (40 mg total) by mouth daily.  Dispense: 90 tablet; Refill: 3 An individual hypertension care plan was established and reinforced today.  The patient's status was assessed using clinical findings on exam and labs or diagnostic tests. The patient's success at meeting treatment goals on disease specific evidence-based  guidelines and found to be poor controlled. Started on lisinpril SELF MANAGEMENT: The patient and I together assessed ways to personally work towards obtaining the recommended goals. RECOMMENDATIONS: avoid decongestants found in common cold remedies, decrease consumption of alcohol, perform routine monitoring of BP with home BP cuff, exercise, reduction of dietary salt, take medicines as prescribed, try not to miss doses and quit smoking.  Regular exercise and maintaining a healthy weight is needed.  Stress reduction may help. A CLINICAL SUMMARY including written plan identify barriers to care unique to individual due to social or financial issues.  We attempt to mutually creat solutions for individual and family understanding.  4. Screening for hypercholesterolemia - Lipid panel - atorvastatin (LIPITOR) 40 MG tablet; Take 1 tablet (40 mg total) by mouth daily.  Dispense: 90 tablet; Refill: 3 Patient will be screened for hypercholesterolemia and will be started on Lipitor due to what appears to be diabetic.  5. Precordial pain - EKG 12-Lead Patient is having rather vague precordial pain and I have not found any chest wall tenderness.  He does not have shortness of breath sweats or radiation of the pain.  The EKG did suggest septal Q's as well as first-degree AV block.  We will will refer to cardiology.    Meds ordered this encounter  Medications  . metFORMIN (GLUCOPHAGE) 500 MG tablet    Sig: Take 1 tablet (500 mg total) by mouth 2 (two) times daily with a meal.    Dispense:  180 tablet    Refill:  3  . lisinopril (ZESTRIL) 20 MG tablet    Sig: Take 1 tablet (20 mg total) by mouth daily.    Dispense:  90 tablet    Refill:  3  . atorvastatin (LIPITOR) 40 MG  tablet    Sig: Take 1 tablet (40 mg total) by mouth daily.    Dispense:  90 tablet    Refill:  3    Orders Placed This Encounter  Procedures  . CBC with Differential/Platelet  . Comprehensive metabolic panel  . Hemoglobin A1c  .  Lipid panel  . Ambulatory referral to Cardiology  . Glucose (CBG)  . POCT UA - Microalbumin  . POCT URINALYSIS DIP (CLINITEK)  . EKG 12-Lead     I spent 40 minutes dedicated to the care of this patient on the date of this encounter to include face-to-face time with the patient, as well as: Preparing to see the patient (review of tests). Obtaining and/or reviewing separately obtained history. Performing a medically appropriate examination and/or evaluation. Counseling and educating the patient/family/caregiver. Ordering medications, tests, or procedures. Communicating with other health care professionals (not separately reported). Documenting clinical information in the electronic or other health record. Independently interpreting results (not separately reported) and communicating results to the patient/family/caregiver.  My nursing staff have aided in the documentation of this note on the behalf of Brent BullaLawrence Alverta Caccamo, MD,as directed by  Brent BullaLawrence Alfio Loescher, MD and thoroughly reviewed by Brent BullaLawrence Townsend Cudworth, MD.  Follow-up: Return in about 2 weeks (around 03/20/2020) for for BP and 3 months for diabetes.  An After Visit Summary was printed and given to the patient.  Brent BullaLawrence Dash Cardarelli, MD [facility] [phone]

## 2020-03-08 DIAGNOSIS — M9903 Segmental and somatic dysfunction of lumbar region: Secondary | ICD-10-CM | POA: Diagnosis not present

## 2020-03-08 DIAGNOSIS — M5451 Vertebrogenic low back pain: Secondary | ICD-10-CM | POA: Diagnosis not present

## 2020-03-08 DIAGNOSIS — M9905 Segmental and somatic dysfunction of pelvic region: Secondary | ICD-10-CM | POA: Diagnosis not present

## 2020-03-08 DIAGNOSIS — M9902 Segmental and somatic dysfunction of thoracic region: Secondary | ICD-10-CM | POA: Diagnosis not present

## 2020-03-09 ENCOUNTER — Other Ambulatory Visit: Payer: Self-pay

## 2020-03-09 MED ORDER — TAMSULOSIN HCL 0.4 MG PO CAPS: 0.4000 mg | ORAL_CAPSULE | Freq: Every day | ORAL | 1 refills | Status: DC

## 2020-03-10 DIAGNOSIS — M9905 Segmental and somatic dysfunction of pelvic region: Secondary | ICD-10-CM | POA: Diagnosis not present

## 2020-03-10 DIAGNOSIS — M9903 Segmental and somatic dysfunction of lumbar region: Secondary | ICD-10-CM | POA: Diagnosis not present

## 2020-03-10 DIAGNOSIS — M5451 Vertebrogenic low back pain: Secondary | ICD-10-CM | POA: Diagnosis not present

## 2020-03-10 DIAGNOSIS — M9902 Segmental and somatic dysfunction of thoracic region: Secondary | ICD-10-CM | POA: Diagnosis not present

## 2020-03-13 DIAGNOSIS — N200 Calculus of kidney: Secondary | ICD-10-CM | POA: Insufficient documentation

## 2020-03-13 DIAGNOSIS — I152 Hypertension secondary to endocrine disorders: Secondary | ICD-10-CM | POA: Insufficient documentation

## 2020-03-13 DIAGNOSIS — E1159 Type 2 diabetes mellitus with other circulatory complications: Secondary | ICD-10-CM | POA: Insufficient documentation

## 2020-03-14 ENCOUNTER — Ambulatory Visit: Payer: BC Managed Care – PPO | Admitting: Cardiology

## 2020-03-20 ENCOUNTER — Other Ambulatory Visit: Payer: Self-pay

## 2020-03-20 ENCOUNTER — Ambulatory Visit: Payer: BC Managed Care – PPO

## 2020-03-20 VITALS — BP 140/78 | HR 78

## 2020-03-20 DIAGNOSIS — I152 Hypertension secondary to endocrine disorders: Secondary | ICD-10-CM

## 2020-03-20 NOTE — Progress Notes (Signed)
Patient came today to recheck Blood pressure. His blood pressure was 140/78. He started lisinopril 20 mg daily since 03/06/2020. Last Blood pressure at the office was 150/100. Dr Marina Goodell recommended to continue with medicines and avoid salt and exercise. Follow up with him in 06/07/2020.

## 2020-04-04 ENCOUNTER — Ambulatory Visit: Payer: BC Managed Care – PPO

## 2020-04-19 ENCOUNTER — Encounter: Payer: Self-pay | Admitting: Legal Medicine

## 2020-05-01 ENCOUNTER — Other Ambulatory Visit: Payer: Self-pay

## 2020-05-01 DIAGNOSIS — Z13220 Encounter for screening for lipoid disorders: Secondary | ICD-10-CM

## 2020-05-01 DIAGNOSIS — E1159 Type 2 diabetes mellitus with other circulatory complications: Secondary | ICD-10-CM

## 2020-05-01 DIAGNOSIS — R739 Hyperglycemia, unspecified: Secondary | ICD-10-CM

## 2020-05-01 DIAGNOSIS — I152 Hypertension secondary to endocrine disorders: Secondary | ICD-10-CM

## 2020-05-01 MED ORDER — LISINOPRIL 20 MG PO TABS: 20.0000 mg | ORAL_TABLET | Freq: Every day | ORAL | 2 refills | Status: DC

## 2020-05-01 MED ORDER — METFORMIN HCL 500 MG PO TABS: 500.0000 mg | ORAL_TABLET | Freq: Two times a day (BID) | ORAL | 2 refills | Status: DC

## 2020-05-01 MED ORDER — ATORVASTATIN CALCIUM 40 MG PO TABS: 40.0000 mg | ORAL_TABLET | Freq: Every day | ORAL | 2 refills | Status: DC

## 2020-06-07 ENCOUNTER — Ambulatory Visit: Payer: Self-pay | Admitting: Legal Medicine

## 2020-10-11 ENCOUNTER — Other Ambulatory Visit: Payer: Self-pay | Admitting: Legal Medicine

## 2020-10-20 ENCOUNTER — Other Ambulatory Visit: Payer: Self-pay

## 2020-10-20 DIAGNOSIS — E1159 Type 2 diabetes mellitus with other circulatory complications: Secondary | ICD-10-CM

## 2020-11-14 ENCOUNTER — Ambulatory Visit: Payer: Self-pay | Admitting: Legal Medicine

## 2020-11-16 ENCOUNTER — Other Ambulatory Visit: Payer: Self-pay

## 2020-11-16 MED ORDER — TAMSULOSIN HCL 0.4 MG PO CAPS: ORAL_CAPSULE | ORAL | 1 refills | Status: DC

## 2020-11-16 NOTE — Telephone Encounter (Signed)
Needs change in pharmacy

## 2021-01-04 ENCOUNTER — Encounter: Payer: Self-pay | Admitting: Legal Medicine

## 2021-04-30 ENCOUNTER — Other Ambulatory Visit: Payer: Self-pay | Admitting: Legal Medicine

## 2021-04-30 DIAGNOSIS — Z13220 Encounter for screening for lipoid disorders: Secondary | ICD-10-CM

## 2021-04-30 DIAGNOSIS — R739 Hyperglycemia, unspecified: Secondary | ICD-10-CM

## 2021-04-30 DIAGNOSIS — I152 Hypertension secondary to endocrine disorders: Secondary | ICD-10-CM

## 2021-08-13 ENCOUNTER — Other Ambulatory Visit: Payer: Self-pay | Admitting: Legal Medicine

## 2021-08-14 ENCOUNTER — Ambulatory Visit (INDEPENDENT_AMBULATORY_CARE_PROVIDER_SITE_OTHER): Payer: Self-pay | Admitting: Legal Medicine

## 2021-08-14 ENCOUNTER — Encounter: Payer: Self-pay | Admitting: Legal Medicine

## 2021-08-14 VITALS — BP 140/82 | HR 80 | Resp 18 | Ht 73.0 in | Wt 243.8 lb

## 2021-08-14 DIAGNOSIS — I350 Nonrheumatic aortic (valve) stenosis: Secondary | ICD-10-CM | POA: Insufficient documentation

## 2021-08-14 DIAGNOSIS — I152 Hypertension secondary to endocrine disorders: Secondary | ICD-10-CM

## 2021-08-14 DIAGNOSIS — E1159 Type 2 diabetes mellitus with other circulatory complications: Secondary | ICD-10-CM

## 2021-08-14 DIAGNOSIS — R739 Hyperglycemia, unspecified: Secondary | ICD-10-CM

## 2021-08-14 DIAGNOSIS — J0100 Acute maxillary sinusitis, unspecified: Secondary | ICD-10-CM

## 2021-08-14 DIAGNOSIS — N400 Enlarged prostate without lower urinary tract symptoms: Secondary | ICD-10-CM

## 2021-08-14 MED ORDER — TAMSULOSIN HCL 0.4 MG PO CAPS: ORAL_CAPSULE | ORAL | 2 refills | Status: DC

## 2021-08-14 MED ORDER — METFORMIN HCL 500 MG PO TABS: 500.0000 mg | ORAL_TABLET | Freq: Two times a day (BID) | ORAL | 2 refills | Status: DC

## 2021-08-14 MED ORDER — AZITHROMYCIN 250 MG PO TABS: ORAL_TABLET | ORAL | 0 refills | Status: AC

## 2021-08-14 NOTE — Progress Notes (Addendum)
? ?Established Patient Office Visit ? ?Subjective   ?Patient ID: Alejandro Zimmerman, male    DOB: 07-29-1965  Age: 56 y.o. MRN: [id number] ? ?Chief Complaint  ?Patient presents with  ? Diabetes  ? Hypertension  ? ? ?HPI: chronic visit ? ?Patient present with type 2 diabetes.  Specifically, this is type 2, noninsulin requiring diabetes, complicated by hyperglycemia.  Compliance with treatment has been good; patient take medicines as directed, maintains diet and exercise regimen, follows up as directed, and is keeping glucose diary.  Date of  diagnosis 2010.  Depression screen has been performed.Tobacco screen nonsmoker. Current medicines for diabetes metformin.  Patient is on lisinopril for renal protection and atorvastatin for cholesterol control.  Patient performs foot exams daily and last ophthalmologic exam was needs eye exam.  ? ?Patient presents for follow up of hypertension.  Patient tolerating lisiorril well with side effects.  Patient was diagnosed with hypertension 2010 so has been treated for hypertension for 12 years.Patient is working on maintaining diet and exercise regimen and follows up as directed. Complication include none.  ? ?Patient presents with hyperlipidemia.  Compliance with treatment has been good; patient takes medicines as directed, maintains low cholesterol diet, follows up as directed, and maintains exercise regimen.  Patient is using atorvastatin without problems.  ?Patient changed pharmacies mid year now with [facility], he says he has been taking medicines regualrly. ? ?Review of Systems  ?Constitutional:  Negative for chills and fever.  ?HENT:  Negative for hearing loss and tinnitus.   ?Eyes:  Negative for redness.  ?Respiratory:  Negative for cough.   ?Cardiovascular:  Negative for chest pain and leg swelling.  ?Gastrointestinal:  Negative for heartburn.  ?Genitourinary:  Negative for dysuria.  ?Musculoskeletal:  Negative for myalgias and neck pain.  ?Skin:  Negative for rash.  ?Neurological:   Negative for dizziness and headaches.  ?Endo/Heme/Allergies:  Negative for polydipsia.  ?Psychiatric/Behavioral: Negative.    ? ?  ?Objective:  ?  ? ?BP (!) 154/82   Pulse 80   Resp 18   Ht 6\' 1"  (1.854 m)   Wt 243 lb 12.8 oz (110.6 kg)   SpO2 98%   BMI 32.17 kg/m?  ? ? ?Physical Exam ?Vitals reviewed.  ?Constitutional:   ?   General: He is not in acute distress. ?   Appearance: Normal appearance. He is obese.  ?HENT:  ?   Head: Normocephalic.  ?   Right Ear: Tympanic membrane normal.  ?   Left Ear: Tympanic membrane normal.  ?   Nose: Nose normal.  ?   Mouth/Throat:  ?   Mouth: Mucous membranes are moist.  ?Eyes:  ?   Extraocular Movements: Extraocular movements intact.  ?   Conjunctiva/sclera: Conjunctivae normal.  ?   Pupils: Pupils are equal, round, and reactive to light.  ?Cardiovascular:  ?   Rate and Rhythm: Normal rate and regular rhythm.  ?   Pulses: Normal pulses.  ?   Heart sounds: Murmur (as murmur) heard.  ?  No gallop.  ?Pulmonary:  ?   Effort: Pulmonary effort is normal. No respiratory distress.  ?   Breath sounds: Normal breath sounds. No wheezing.  ?Abdominal:  ?   General: Abdomen is flat. Bowel sounds are normal. There is no distension.  ?   Tenderness: There is no abdominal tenderness.  ?Musculoskeletal:     ?   General: Normal range of motion.  ?   Cervical back: Normal range of motion and neck  supple.  ?   Right lower leg: No edema.  ?   Left lower leg: No edema.  ?Skin: ?   General: Skin is warm.  ?   Capillary Refill: Capillary refill takes less than 2 seconds.  ?Neurological:  ?   General: No focal deficit present.  ?   Mental Status: He is alert and oriented to person, place, and time. Mental status is at baseline.  ?   Gait: Gait normal.  ?   Deep Tendon Reflexes: Reflexes normal.  ?Psychiatric:     ?   Mood and Affect: Mood normal.     ?   Thought Content: Thought content normal.  ? ? ? ? ? ? ? ?The ASCVD Risk score (Arnett DK, et al., 2019) failed to calculate for the following  reasons: ?  Cannot find a previous HDL lab ?  Cannot find a previous total cholesterol lab ? ?  ?Assessment & Plan:  ? ?Problem List Items Addressed This Visit   ? ?  ? Cardiovascular and Mediastinum  ? Hypertension associated with diabetes (HCC) - Primary  ? Relevant Medications  ? metFORMIN (GLUCOPHAGE) 500 MG tablet  ? Other Relevant Orders  ? Comprehensive metabolic panel  ? Hemoglobin A1c  ? Lipid panel  ? CBC with Differential/Platelet  ? Microalbumin / creatinine urine ratio ?An individual hypertension care plan was established and reinforced today.  The patient's status was assessed using clinical findings on exam and labs or diagnostic tests. The patient's success at meeting treatment goals on disease specific evidence-based guidelines and found to be fair controlled. ?SELF MANAGEMENT: The patient and I together assessed ways to personally work towards obtaining the recommended goals. ?RECOMMENDATIONS: avoid decongestants found in common cold remedies, decrease consumption of alcohol, perform routine monitoring of BP with home BP cuff, exercise, reduction of dietary salt, take medicines as prescribed, try not to miss doses and quit smoking.  Regular exercise and maintaining a healthy weight is needed.  Stress reduction may help. ?A CLINICAL SUMMARY including written plan identify barriers to care unique to individual due to social or financial issues.  We attempt to mutually creat solutions for individual and family understanding.  ? ?An individual care plan for diabetes was established and reinforced today.  The patient's status was assessed using clinical findings on exam, labs and diagnostic testing. Patient success at meeting goals based on disease specific evidence-based guidelines and found to be uncertain- no A1c controlled. ?Medications were assessed and patient's understanding of the medical issues , including barriers were assessed. ?Recommend adherence to a diabetic diet, a graduated exercise  program, HgbA1c level is checked quarterly, and urine microalbumin performed yearly .  Annual mono-filament sensation testing performed. Lower blood pressure and control hyperlipidemia is important. Get annual eye exams and annual flu shots and smoking cessation discussed.  Self management goals were discussed.  ?  ? Aortic stenosis ?New murmur heard at the base of the heart rating to both carotids he has good upstroke of the the systolic slope he has had no symptoms suggestive of aortic stenosis.  I discussed possible cardiology consultation he wants to wait at the present time since he has no symptoms and he has no insurance at this time.  ? ?Other Visit Diagnoses   ? ? Acute non-recurrent maxillary sinusitis      ? Relevant Medications  ? azithromycin (ZITHROMAX) 250 MG tablet ?Patient has sinusitis , gave mucinex DM samples ?  ? Benign prostatic hyperplasia without lower urinary  tract symptoms      ? Relevant Medications  ? tamsulosin (FLOMAX) 0.4 MG CAPS capsule ?AN INDIVIDUAL CARE PLAN for BPH was established and reinforced today.  The patient's status was assessed using clinical findings on exam, labs, and other diagnostic testing. Patient's success at meeting treatment goals based on disease specific evidence-bassed guidelines and found to be in good control. ?RECOMMENDATIONS include maintaining present medicines and treatment.  ?  ? Hyperglycemia      ? Relevant Medications  ? metFORMIN (GLUCOPHAGE) 500 MG tablet ?Related to diabetes control, DASH diet given and meal plan  ? ?  ? ? ?Return in about 6 months (around 02/13/2022) for fasting.  ? ? ?Brent BullaLawrence Marten Iles, MD ? ?

## 2021-08-14 NOTE — Patient Instructions (Signed)

## 2021-08-15 LAB — CBC WITH DIFFERENTIAL/PLATELET
Basophils Absolute: 0.1 10*3/uL (ref 0.0–0.2)
Basos: 1 %
EOS (ABSOLUTE): 0.3 10*3/uL (ref 0.0–0.4)
Eos: 4 %
Hematocrit: 49.2 % (ref 37.5–51.0)
Hemoglobin: 16.9 g/dL (ref 13.0–17.7)
Immature Grans (Abs): 0 10*3/uL (ref 0.0–0.1)
Immature Granulocytes: 0 %
Lymphocytes Absolute: 3.6 10*3/uL — ABNORMAL HIGH (ref 0.7–3.1)
Lymphs: 39 %
MCH: 29.9 pg (ref 26.6–33.0)
MCHC: 34.3 g/dL (ref 31.5–35.7)
MCV: 87 fL (ref 79–97)
Monocytes Absolute: 0.8 10*3/uL (ref 0.1–0.9)
Monocytes: 9 %
Neutrophils Absolute: 4.4 10*3/uL (ref 1.4–7.0)
Neutrophils: 47 %
Platelets: 199 10*3/uL (ref 150–450)
RBC: 5.66 x10E6/uL (ref 4.14–5.80)
RDW: 11.6 % (ref 11.6–15.4)
WBC: 9.1 10*3/uL (ref 3.4–10.8)

## 2021-08-15 LAB — LIPID PANEL
Chol/HDL Ratio: 2 ratio (ref 0.0–5.0)
Cholesterol, Total: 133 mg/dL (ref 100–199)
HDL: 67 mg/dL (ref >39)
LDL Chol Calc (NIH): 55 mg/dL (ref 0–99)
Triglycerides: 46 mg/dL (ref 0–149)
VLDL Cholesterol Cal: 11 mg/dL (ref 5–40)

## 2021-08-15 LAB — COMPREHENSIVE METABOLIC PANEL
ALT: 22 IU/L (ref 0–44)
AST: 20 IU/L (ref 0–40)
Albumin/Globulin Ratio: 1.5 (ref 1.2–2.2)
Albumin: 4.4 g/dL (ref 3.8–4.9)
Alkaline Phosphatase: 88 IU/L (ref 44–121)
BUN/Creatinine Ratio: 10 (ref 9–20)
BUN: 10 mg/dL (ref 6–24)
Bilirubin Total: 0.7 mg/dL (ref 0.0–1.2)
CO2: 22 mmol/L (ref 20–29)
Calcium: 9.2 mg/dL (ref 8.7–10.2)
Chloride: 103 mmol/L (ref 96–106)
Creatinine, Ser: 0.97 mg/dL (ref 0.76–1.27)
Globulin, Total: 3 g/dL (ref 1.5–4.5)
Glucose: 208 mg/dL — ABNORMAL HIGH (ref 70–99)
Potassium: 4.1 mmol/L (ref 3.5–5.2)
Sodium: 138 mmol/L (ref 134–144)
Total Protein: 7.4 g/dL (ref 6.0–8.5)
eGFR: 91 mL/min/{1.73_m2} (ref >59)

## 2021-08-15 LAB — MICROALBUMIN / CREATININE URINE RATIO
Creatinine, Urine: 180.8 mg/dL
Microalb/Creat Ratio: 26 mg/g creat (ref 0–29)
Microalbumin, Urine: 46.4 ug/mL

## 2021-08-15 LAB — HEMOGLOBIN A1C
Est. average glucose Bld gHb Est-mCnc: 255 mg/dL
Hgb A1c MFr Bld: 10.5 % — ABNORMAL HIGH (ref 4.8–5.6)

## 2021-08-15 LAB — CARDIOVASCULAR RISK ASSESSMENT

## 2021-08-15 NOTE — Progress Notes (Signed)
Glucose 208, kidney and liver tests normal, A1c 10.5 very high, hemoglobin and hematocrit elevated, cholesterol normal, Microalbumin normal, we need to see for better DM control and set up dietary counseling ?lp

## 2021-08-20 ENCOUNTER — Other Ambulatory Visit: Payer: Self-pay

## 2021-08-20 MED ORDER — METFORMIN HCL 1000 MG PO TABS: 1000.0000 mg | ORAL_TABLET | Freq: Two times a day (BID) | ORAL | 3 refills | Status: DC

## 2021-08-20 MED ORDER — PIOGLITAZONE HCL 15 MG PO TABS: 15.0000 mg | ORAL_TABLET | Freq: Every day | ORAL | 3 refills | Status: DC

## 2021-08-23 ENCOUNTER — Other Ambulatory Visit: Payer: Self-pay

## 2021-08-23 MED ORDER — PIOGLITAZONE HCL 15 MG PO TABS
15.0000 mg | ORAL_TABLET | Freq: Every day | ORAL | 1 refills | Status: DC
Start: 2021-08-23 — End: 2022-03-05

## 2021-09-18 ENCOUNTER — Other Ambulatory Visit: Payer: Self-pay

## 2021-11-06 NOTE — Progress Notes (Deleted)
Subjective:  Patient ID: Alejandro Zimmerman, male    DOB: June 08, 1965  Age: 56 y.o. MRN: [id number]  No chief complaint on file.   Diabetes Pertinent negatives for hypoglycemia include no dizziness or headaches. Pertinent negatives for diabetes include no chest pain and no fatigue.  Hyperlipidemia Pertinent negatives include no chest pain, myalgias or shortness of breath.  Hypertension Pertinent negatives include no chest pain, headaches or shortness of breath.       Patient present with type 2 diabetes.  Specifically, this is type 2, noninsulin requiring diabetes, complicated by hyperglycemia.  Compliance with treatment has been good; patient take medicines as directed, maintains diet and exercise regimen, follows up as directed, and is keeping glucose diary.  Date of  diagnosis 2010.  Depression screen has been performed.Tobacco screen nonsmoker. Current medicines for diabetes metformin.  Patient is on lisinopril for renal protection and atorvastatin for cholesterol control.  Patient performs foot exams daily and last ophthalmologic exam was needs eye exam.    Patient presents for follow up of hypertension.  Patient tolerating lisiorril well with side effects.  Patient was diagnosed with hypertension 2010 so has been treated for hypertension for 12 years.Patient is working on maintaining diet and exercise regimen and follows up as directed. Complication include none.    Patient presents with hyperlipidemia.  Compliance with treatment has been good; patient takes medicines as directed, maintains low cholesterol diet, follows up as directed, and maintains exercise regimen.  Patient is using atorvastatin without problems.  Patient changed pharmacies mid year now with [facility], he says he has been taking medicines regualrly.   Diabetes Mellitus Type II, follow-up  Lab Results  Component Value Date   HGBA1C 10.5 (H) 08/14/2021   Last seen for diabetes {1-12:18279} {days/wks/mos/yrs:310907} ago.   Management since then includes actos, metformin  He reports excellent compliance with treatment. He is not having side effects.   Home blood sugar records: {diabetes glucometry results:16657} Most Recent Eye Exam: ***  --------------------------------------------------------------------------------------------------- Hypertension, follow-up  BP Readings from Last 3 Encounters:  08/14/21 140/82  03/20/20 140/78  03/06/20 (!) 150/100   Wt Readings from Last 3 Encounters:  08/14/21 243 lb 12.8 oz (110.6 kg)  03/06/20 248 lb 9.6 oz (112.8 kg)  02/22/16 250 lb (113.4 kg)     BP at that visit was ***. Management since that visit includes lisinopril. He reports excellent compliance with treatment. He is not having side effects.  He {is/is not:9024} exercising. He {is/is not:9024} adherent to low salt diet.    He {does/does not:200015} smoke.  -------------------------------------------------------------------------------------------------- Lipid/Cholesterol, follow-up  Last Lipid Panel: Lab Results  Component Value Date   CHOL 133 08/14/2021   LDLCALC 55 08/14/2021   HDL 67 08/14/2021   TRIG 46 08/14/2021   Management since that visit includes lipitor.  He reports excellent compliance with treatment. He is not having side effects.   He is following a {diet:21022986} diet. Current exercise: {exercise types:16438}  Last metabolic panel Lab Results  Component Value Date   GLUCOSE 208 (H) 08/14/2021   NA 138 08/14/2021   K 4.1 08/14/2021   BUN 10 08/14/2021   CREATININE 0.97 08/14/2021   CALCIUM 9.2 08/14/2021   AST 20 08/14/2021   ALT 22 08/14/2021   The 10-year ASCVD risk score (Arnett DK, et al., 2019) is: 13.3%  ---------------------------------------------------------------------------------------------------  Current Outpatient Medications on File Prior to Visit  Medication Sig Dispense Refill   atorvastatin (LIPITOR) 40 MG tablet TAKE 1 TABLET(40 MG) BY  MOUTH DAILY 90 tablet 2   lisinopril (ZESTRIL) 20 MG tablet TAKE 1 TABLET(20 MG) BY MOUTH DAILY 90 tablet 2   metFORMIN (GLUCOPHAGE) 1000 MG tablet Take 1 tablet (1,000 mg total) by mouth 2 (two) times daily with a meal. 60 tablet 3   Multiple Vitamins-Minerals (MULTIVITAMIN WITH MINERALS) tablet Take 1 tablet by mouth daily.     pioglitazone (ACTOS) 15 MG tablet Take 1 tablet (15 mg total) by mouth daily. 90 tablet 1   tamsulosin (FLOMAX) 0.4 MG CAPS capsule TAKE 1 CAPSULE BY MOUTH ONCE DAILY AFTERSUPPER 90 capsule 2   No current facility-administered medications on file prior to visit.   Past Medical History:  Diagnosis Date   Hyperglycemia 03/06/2020   Hypertension associated with diabetes (HCC)    Kidney stones    Obesity 02/02/2014   Past Surgical History:  Procedure Laterality Date   hiatal hernia  1998   left stent with stone removal Left 12/04/2014   [facility]    Family History  Problem Relation Age of Onset   Prostate cancer Father    Social History   Socioeconomic History   Marital status: Divorced    Spouse name: Not on file   Number of children: 1   Years of education: Not on file   Highest education level: Not on file  Occupational History   Not on file  Tobacco Use   Smoking status: Never   Smokeless tobacco: Never  Substance and Sexual Activity   Alcohol use: Yes    Comment: socially    Drug use: Never   Sexual activity: Yes  Other Topics Concern   Not on file  Social History Narrative   Not on file   Social Determinants of Health   Financial Resource Strain: Not on file  Food Insecurity: Not on file  Transportation Needs: Not on file  Physical Activity: Not on file  Stress: Not on file  Social Connections: Not on file    Review of Systems  Constitutional:  Negative for chills, diaphoresis, fatigue and fever.  HENT:  Negative for congestion, ear pain and sore throat.   Respiratory:  Negative for cough and shortness of breath.    Cardiovascular:  Negative for chest pain and leg swelling.  Gastrointestinal:  Negative for abdominal pain, constipation, diarrhea, nausea and vomiting.  Genitourinary:  Negative for dysuria and urgency.  Musculoskeletal:  Negative for arthralgias and myalgias.  Neurological:  Negative for dizziness and headaches.  Psychiatric/Behavioral:  Negative for dysphoric mood.      Objective:  There were no vitals taken for this visit.     08/14/2021    1:54 PM 03/20/2020   11:24 AM 03/06/2020    8:38 AM  BP/Weight  Systolic BP 140 140 150  Diastolic BP 82 78 100  Wt. (Lbs) 243.8  248.6  BMI 32.17 kg/m2  32.8 kg/m2    Physical Exam Vitals reviewed.  Constitutional:      Appearance: Normal appearance. He is normal weight.  Cardiovascular:     Rate and Rhythm: Normal rate and regular rhythm.     Heart sounds: No murmur heard. Pulmonary:     Effort: Pulmonary effort is normal.     Breath sounds: Normal breath sounds.  Abdominal:     General: Abdomen is flat. Bowel sounds are normal.     Palpations: Abdomen is soft.     Tenderness: There is no abdominal tenderness.  Neurological:     Mental Status: He is alert and  oriented to person, place, and time.  Psychiatric:        Mood and Affect: Mood normal.        Behavior: Behavior normal.     Diabetic Foot Exam - Simple   No data filed      Lab Results  Component Value Date   WBC 9.1 08/14/2021   HGB 16.9 08/14/2021   HCT 49.2 08/14/2021   PLT 199 08/14/2021   GLUCOSE 208 (H) 08/14/2021   CHOL 133 08/14/2021   TRIG 46 08/14/2021   HDL 67 08/14/2021   LDLCALC 55 08/14/2021   ALT 22 08/14/2021   AST 20 08/14/2021   NA 138 08/14/2021   K 4.1 08/14/2021   CL 103 08/14/2021   CREATININE 0.97 08/14/2021   BUN 10 08/14/2021   CO2 22 08/14/2021   HGBA1C 10.5 (H) 08/14/2021   MICROALBUR 80 03/06/2020      Assessment & Plan:   Problem List Items Addressed This Visit   None .  No orders of the defined types were  placed in this encounter.   No orders of the defined types were placed in this encounter.    Follow-up: No follow-ups on file.  An After Visit Summary was printed and given to the patient.  Janie Morning, NP [facility] [phone]

## 2021-11-08 ENCOUNTER — Encounter: Payer: Self-pay | Admitting: Nurse Practitioner

## 2021-11-08 DIAGNOSIS — E1159 Type 2 diabetes mellitus with other circulatory complications: Secondary | ICD-10-CM

## 2021-12-18 NOTE — Progress Notes (Signed)
This encounter was created in error - please disregard.

## 2022-01-28 ENCOUNTER — Other Ambulatory Visit: Payer: Self-pay

## 2022-01-28 DIAGNOSIS — R739 Hyperglycemia, unspecified: Secondary | ICD-10-CM

## 2022-01-28 DIAGNOSIS — Z13220 Encounter for screening for lipoid disorders: Secondary | ICD-10-CM

## 2022-01-28 DIAGNOSIS — E1159 Type 2 diabetes mellitus with other circulatory complications: Secondary | ICD-10-CM

## 2022-01-28 MED ORDER — LISINOPRIL 20 MG PO TABS: 20.0000 mg | ORAL_TABLET | Freq: Every day | ORAL | 1 refills | Status: DC

## 2022-01-28 MED ORDER — ATORVASTATIN CALCIUM 40 MG PO TABS: 40.0000 mg | ORAL_TABLET | Freq: Every day | ORAL | 1 refills | Status: DC

## 2022-02-13 ENCOUNTER — Ambulatory Visit: Payer: Self-pay | Admitting: Legal Medicine

## 2022-03-01 ENCOUNTER — Ambulatory Visit: Payer: Self-pay | Admitting: Nurse Practitioner

## 2022-03-01 ENCOUNTER — Encounter: Payer: Self-pay | Admitting: Nurse Practitioner

## 2022-03-01 VITALS — BP 152/90 | HR 85 | Temp 97.3°F | Ht 73.0 in | Wt 247.0 lb

## 2022-03-01 DIAGNOSIS — Z5329 Procedure and treatment not carried out because of patient's decision for other reasons: Secondary | ICD-10-CM

## 2022-03-01 DIAGNOSIS — I10 Essential (primary) hypertension: Secondary | ICD-10-CM

## 2022-03-01 DIAGNOSIS — I152 Hypertension secondary to endocrine disorders: Secondary | ICD-10-CM

## 2022-03-01 DIAGNOSIS — E1165 Type 2 diabetes mellitus with hyperglycemia: Secondary | ICD-10-CM

## 2022-03-01 DIAGNOSIS — E1159 Type 2 diabetes mellitus with other circulatory complications: Secondary | ICD-10-CM

## 2022-03-01 MED ORDER — EMPAGLIFLOZIN 25 MG PO TABS: 25.0000 mg | ORAL_TABLET | Freq: Every day | ORAL | 1 refills | Status: DC

## 2022-03-01 MED ORDER — LISINOPRIL 40 MG PO TABS: 40.0000 mg | ORAL_TABLET | Freq: Every day | ORAL | 3 refills | Status: DC

## 2022-03-01 MED ORDER — EMPAGLIFLOZIN 25 MG PO TABS: 25.0000 mg | ORAL_TABLET | Freq: Every day | ORAL | 0 refills | Status: DC

## 2022-03-01 NOTE — Patient Instructions (Addendum)
We will call you with lab results Begin Jardiance 25 mg daily for diabetes Apply for financial assistance with Jardiance Recommend routine eye exam Increase Lisinopril to 40 mg daily Monitor BP, keep log Follow-up in 2 weeks for BP check, bring BP log     Diabetes Mellitus and Nutrition, Adult When you have diabetes, or diabetes mellitus, it is very important to have healthy eating habits because your blood sugar (glucose) levels are greatly affected by what you eat and drink. Eating healthy foods in the right amounts, at about the same times every day, can help you: Manage your blood glucose. Lower your risk of heart disease. Improve your blood pressure. Reach or maintain a healthy weight. What can affect my meal plan? Every person with diabetes is different, and each person has different needs for a meal plan. Your health care provider may recommend that you work with a dietitian to make a meal plan that is best for you. Your meal plan may vary depending on factors such as: The calories you need. The medicines you take. Your weight. Your blood glucose, blood pressure, and cholesterol levels. Your activity level. Other health conditions you have, such as heart or kidney disease. How do carbohydrates affect me? Carbohydrates, also called carbs, affect your blood glucose level more than any other type of food. Eating carbs raises the amount of glucose in your blood. It is important to know how many carbs you can safely have in each meal. This is different for every person. Your dietitian can help you calculate how many carbs you should have at each meal and for each snack. How does alcohol affect me? Alcohol can cause a decrease in blood glucose (hypoglycemia), especially if you use insulin or take certain diabetes medicines by mouth. Hypoglycemia can be a life-threatening condition. Symptoms of hypoglycemia, such as sleepiness, dizziness, and confusion, are similar to symptoms of having  too much alcohol. Do not drink alcohol if: Your health care provider tells you not to drink. You are pregnant, may be pregnant, or are planning to become pregnant. If you drink alcohol: Limit how much you have to: 0-1 drink a day for women. 0-2 drinks a day for men. Know how much alcohol is in your drink. In the [place] one drink equals one 12 oz bottle of beer (355 mL), one 5 oz glass of wine (148 mL), or one 1 oz glass of hard liquor (44 mL). Keep yourself hydrated with water, diet soda, or unsweetened iced tea. Keep in mind that regular soda, juice, and other mixers may contain a lot of sugar and must be counted as carbs. What are tips for following this plan?  Reading food labels Start by checking the serving size on the Nutrition Facts label of packaged foods and drinks. The number of calories and the amount of carbs, fats, and other nutrients listed on the label are based on one serving of the item. Many items contain more than one serving per package. Check the total grams (g) of carbs in one serving. Check the number of grams of saturated fats and trans fats in one serving. Choose foods that have a low amount or none of these fats. Check the number of milligrams (mg) of salt (sodium) in one serving. Most people should limit total sodium intake to less than 2,300 mg per day. Always check the nutrition information of foods labeled as "low-fat" or "nonfat." These foods may be higher in added sugar or refined carbs and should be avoided.  Talk to your dietitian to identify your daily goals for nutrients listed on the label. Shopping Avoid buying canned, pre-made, or processed foods. These foods tend to be high in fat, sodium, and added sugar. Shop around the outside edge of the grocery store. This is where you will most often find fresh fruits and vegetables, bulk grains, fresh meats, and fresh dairy products. Cooking Use low-heat cooking methods, such as baking, instead of high-heat cooking  methods, such as deep frying. Cook using healthy oils, such as olive, canola, or sunflower oil. Avoid cooking with butter, cream, or high-fat meats. Meal planning Eat meals and snacks regularly, preferably at the same times every day. Avoid going Mcnay periods of time without eating. Eat foods that are high in fiber, such as fresh fruits, vegetables, beans, and whole grains. Eat 4-6 oz (112-168 g) of lean protein each day, such as lean meat, chicken, fish, eggs, or tofu. One ounce (oz) (28 g) of lean protein is equal to: 1 oz (28 g) of meat, chicken, or fish. 1 egg.  cup (62 g) of tofu. Eat some foods each day that contain healthy fats, such as avocado, nuts, seeds, and fish. What foods should I eat? Fruits Berries. Apples. Oranges. Peaches. Apricots. Plums. Grapes. Mangoes. Papayas. Pomegranates. Kiwi. Cherries. Vegetables Leafy greens, including lettuce, spinach, kale, chard, collard greens, mustard greens, and cabbage. Beets. Cauliflower. Broccoli. Carrots. Green beans. Tomatoes. Peppers. Onions. Cucumbers. Brussels sprouts. Grains Whole grains, such as whole-wheat or whole-grain bread, crackers, tortillas, cereal, and pasta. Unsweetened oatmeal. Quinoa. Brown or wild rice. Meats and other proteins Seafood. Poultry without skin. Lean cuts of poultry and beef. Tofu. Nuts. Seeds. Dairy Low-fat or fat-free dairy products such as milk, yogurt, and cheese. The items listed above may not be a complete list of foods and beverages you can eat and drink. Contact a dietitian for more information. What foods should I avoid? Fruits Fruits canned with syrup. Vegetables Canned vegetables. Frozen vegetables with butter or cream sauce. Grains Refined white flour and flour products such as bread, pasta, snack foods, and cereals. Avoid all processed foods. Meats and other proteins Fatty cuts of meat. Poultry with skin. Breaded or fried meats. Processed meat. Avoid saturated fats. Dairy Full-fat  yogurt, cheese, or milk. Beverages Sweetened drinks, such as soda or iced tea. The items listed above may not be a complete list of foods and beverages you should avoid. Contact a dietitian for more information. Questions to ask a health care provider Do I need to meet with a certified diabetes care and education specialist? Do I need to meet with a dietitian? What number can I call if I have questions? When are the best times to check my blood glucose? Where to find more information: American Diabetes Association: diabetes.org Academy of Nutrition and Dietetics: eatright.[employer] of Diabetes and Digestive and Kidney Diseases: StageSync.si Association of Diabetes Care & Education Specialists: diabeteseducator.org Summary It is important to have healthy eating habits because your blood sugar (glucose) levels are greatly affected by what you eat and drink. It is important to use alcohol carefully. A healthy meal plan will help you manage your blood glucose and lower your risk of heart disease. Your health care provider may recommend that you work with a dietitian to make a meal plan that is best for you. This information is not intended to replace advice given to you by your health care provider. Make sure you discuss any questions you have with your health care provider. Document  Revised: 11/10/2019 Document Reviewed: 11/10/2019 Elsevier Patient Education  [place].

## 2022-03-01 NOTE — Progress Notes (Unsigned)
Subjective:  Patient ID: Alejandro Zimmerman, male    DOB: 1966-04-13  Age: 56 y.o. MRN: [id number]  Chief Complaint  Patient presents with   Diabetes   Hypertension   HPI: Pt presents for follow-up of type 2 DM, hypertension, and hyperlipidemia. 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 Hopes to secure health insurance during open enrollment in the next few weeks.    Diabetes Mellitus Type II, Follow-up  Lab Results  Component Value Date   HGBA1C 10.5 (H) 08/14/2021   Wt Readings from Last 3 Encounters:  08/14/21 243 lb 12.8 oz (110.6 kg)  03/06/20 248 lb 9.6 oz (112.8 kg)  02/22/16 250 lb (113.4 kg)   Last seen for diabetes 6 months ago.  Management  includes Metformin 1000 mg BID, Actos 15 mg daily. Quit taking metformin 3 weeks ago due to side effects. He reports poor compliance with treatment. He is having side effects. Fatigue and Diarrhea  Patient states when he sleeps on his left or right shoulder at times his fingers tip go numb, also stated that sometimes when he tries to open a bottle if feels like needles are sticking his hands. At times has numbness and tingling in his big toe.  Home blood sugar records: fasting range: 179-320  Most Recent Eye Exam: overdue Current exercise: yard work Current diet habits: in general, a "healthy" diet    Lipid/Cholesterol, Follow-up  Last lipid panel Other pertinent labs  Lab Results  Component Value Date   CHOL 133 08/14/2021   HDL 67 08/14/2021   LDLCALC 55 08/14/2021   TRIG 46 08/14/2021   CHOLHDL 2.0 08/14/2021   Lab Results  Component Value Date   ALT 22 08/14/2021   AST 20 08/14/2021   PLT 199 08/14/2021     He was last seen for this 6 months ago.  Management includes lipitor 40 mg daily. He reports excellent compliance with treatment. He is not having side effects.   Hypertension, follow-up: He was last seen for hypertension 6 months ago.  BP at that visit was  Management includes lisinopril  20 mg.          He reports excellent compliance with treatment. He is not having side effects.  He is following a Regular diet.  Pertinent labs: Lab Results  Component Value Date   CHOL 133 08/14/2021   HDL 67 08/14/2021   LDLCALC 55 08/14/2021   TRIG 46 08/14/2021   CHOLHDL 2.0 08/14/2021   Lab Results  Component Value Date   NA 138 08/14/2021   K 4.1 08/14/2021   CREATININE 0.97 08/14/2021   EGFR 91 08/14/2021   GLUCOSE 208 (H) 08/14/2021     The 10-year ASCVD risk score (Arnett DK, et al., 2019) is: 13.3%   Current Outpatient Medications on File Prior to Visit  Medication Sig Dispense Refill   atorvastatin (LIPITOR) 40 MG tablet Take 1 tablet (40 mg total) by mouth daily. 90 tablet 1   lisinopril (ZESTRIL) 20 MG tablet Take 1 tablet (20 mg total) by mouth daily. 90 tablet 1   metFORMIN (GLUCOPHAGE) 1000 MG tablet Take 1 tablet (1,000 mg total) by mouth 2 (two) times daily with a meal. 60 tablet 3   Multiple Vitamins-Minerals (MULTIVITAMIN WITH MINERALS) tablet Take 1 tablet by mouth daily.     pioglitazone (ACTOS) 15 MG tablet Take 1 tablet (15 mg total) by mouth daily. 90 tablet 1   tamsulosin (FLOMAX) 0.4 MG CAPS capsule TAKE 1 CAPSULE BY  MOUTH ONCE DAILY AFTERSUPPER 90 capsule 2   No current facility-administered medications on file prior to visit.   Past Medical History:  Diagnosis Date   Hyperglycemia 03/06/2020   Hypertension associated with diabetes ([place])    Kidney stones    Obesity 02/02/2014   Past Surgical History:  Procedure Laterality Date   hiatal hernia  1998   left stent with stone removal Left 12/04/2014   [facility]    Family History  Problem Relation Age of Onset   Prostate cancer Father    Social History   Socioeconomic History   Marital status: Divorced    Spouse name: Not on file   Number of children: 1   Years of education: Not on file   Highest education level: Not on file  Occupational History   Not on file  Tobacco Use    Smoking status: Never   Smokeless tobacco: Never  Substance and Sexual Activity   Alcohol use: Yes    Comment: socially    Drug use: Never   Sexual activity: Yes  Other Topics Concern   Not on file  Social History Narrative   Not on file   Social Determinants of Health   Financial Resource Strain: Not on file  Food Insecurity: Not on file  Transportation Needs: Not on file  Physical Activity: Not on file  Stress: Not on file  Social Connections: Not on file    Review of Systems  Constitutional:  Negative for chills, diaphoresis, fatigue and fever.  HENT:  Negative for congestion, ear pain and sore throat.   Respiratory:  Negative for cough and shortness of breath.   Cardiovascular:  Negative for chest pain and leg swelling.  Gastrointestinal:  Negative for abdominal pain, constipation, diarrhea, nausea and vomiting.  Endocrine: Positive for polydipsia.  Genitourinary:  Negative for dysuria and urgency.  Musculoskeletal:  Negative for arthralgias and myalgias.  Neurological:  Positive for numbness (fingertips and toes). Negative for dizziness and headaches.  Psychiatric/Behavioral:  Negative for dysphoric mood.      Objective:  BP (!) 152/90   Pulse 85   Temp (!) 97.3 F (36.3 C)   Ht _0  (1.854 m)   Wt 247 lb (112 kg)   SpO2 98%   BMI 32.59 kg/m       08/14/2021    1:54 PM 03/20/2020   11:24 AM 03/06/2020    8:38 AM  BP/Weight  Systolic BP 025 427 062  Diastolic BP 82 78 376  Wt. (Lbs) 243.8  248.6  BMI 32.17 kg/m2  32.8 kg/m2    Physical Exam Vitals reviewed.  Constitutional:      Appearance: Normal appearance.  HENT:     Right Ear: Tympanic membrane normal.     Left Ear: Tympanic membrane normal.     Mouth/Throat:     Mouth: Mucous membranes are moist.  Eyes:     Pupils: Pupils are equal, round, and reactive to light.  Cardiovascular:     Rate and Rhythm: Normal rate.     Pulses: Normal pulses.  Abdominal:     General: Bowel sounds are  normal.     Palpations: Abdomen is soft.  Skin:    General: Skin is warm and dry.     Capillary Refill: Capillary refill takes less than 2 seconds.  Neurological:     General: No focal deficit present.     Mental Status: He is alert and oriented to person, place, and time.  Psychiatric:  Mood and Affect: Mood normal.        Behavior: Behavior normal.    Lab Results  Component Value Date   WBC 9.1 08/14/2021   HGB 16.9 08/14/2021   HCT 49.2 08/14/2021   PLT 199 08/14/2021   GLUCOSE 208 (H) 08/14/2021   CHOL 133 08/14/2021   TRIG 46 08/14/2021   HDL 67 08/14/2021   LDLCALC 55 08/14/2021   ALT 22 08/14/2021   AST 20 08/14/2021   NA 138 08/14/2021   K 4.1 08/14/2021   CL 103 08/14/2021   CREATININE 0.97 08/14/2021   BUN 10 08/14/2021   CO2 22 08/14/2021   HGBA1C 10.5 (H) 08/14/2021   MICROALBUR 80 03/06/2020      Assessment & Plan:  1. Uncontrolled type 2 diabetes mellitus with hyperglycemia (HCC)-not at goal - empagliflozin (JARDIANCE) 25 MG TABS tablet; Take 1 tablet (25 mg total) by mouth daily before breakfast.  Dispense: 28 tablet; Refill: 0 - empagliflozin (JARDIANCE) 25 MG TABS tablet; Take 1 tablet (25 mg total) by mouth daily before breakfast.  Dispense: 90 tablet; Refill: 1  2. Hypertension associated with diabetes (HCC)-not at goal - empagliflozin (JARDIANCE) 25 MG TABS tablet; Take 1 tablet (25 mg total) by mouth daily before breakfast.  Dispense: 28 tablet; Refill: 0 - empagliflozin (JARDIANCE) 25 MG TABS tablet; Take 1 tablet (25 mg total) by mouth daily before breakfast.  Dispense: 90 tablet; Refill: 1 - lisinopril (ZESTRIL) 40 MG tablet; Take 1 tablet (40 mg total) by mouth daily.  Dispense: 90 tablet; Refill: 3  3. Uncontrolled hypertension-not at goal - lisinopril (ZESTRIL) 40 MG tablet; Take 1 tablet (40 mg total) by mouth daily.  Dispense: 90 tablet; Refill: 3 -low salt DASH diet -monitor BP, keep log -return in 2 weeks for hypertension  follow-up  4. Care refused by patient  -pt declined all labs ordered: PSA, CBC, CMP, LIPID, HGBA1C due to financial constraints. States he will obtain fasting labs after he secures health insurance in a few weeks. -pt counseled on the importance on monitoring labs for surveillance of effectiveness of treatment, side effects of medications,    -declines colon cancer and PSA screening and immunizations, will defer until a later date. States his father died of prostate cancer.      We will call you with lab results Begin Jardiance 25 mg daily for diabetes Apply for financial assistance with Jardiance Recommend routine eye exam Increase Lisinopril to 40 mg daily Monitor BP, keep log Follow-up in 2 weeks for BP check, bring BP log  Follow-up: 2-weeks for nurse visit BP check; 45-monthfollow-up  An After Visit Summary was printed and given to the patient.  I, SRip Harbour NP, have reviewed all documentation for this visit. The documentation on 03/02/22 for the exam, diagnosis, procedures, and orders are all accurate and complete.    Signed, SRip Harbour NP [place]([phone]

## 2022-03-05 ENCOUNTER — Other Ambulatory Visit: Payer: Self-pay

## 2022-03-05 MED ORDER — PIOGLITAZONE HCL 15 MG PO TABS: 15.0000 mg | ORAL_TABLET | Freq: Every day | ORAL | 1 refills | Status: DC

## 2022-03-18 ENCOUNTER — Ambulatory Visit: Payer: Self-pay

## 2022-06-05 ENCOUNTER — Ambulatory Visit: Payer: Self-pay | Admitting: Family Medicine

## 2022-06-10 ENCOUNTER — Other Ambulatory Visit: Payer: Self-pay

## 2022-06-10 DIAGNOSIS — E1159 Type 2 diabetes mellitus with other circulatory complications: Secondary | ICD-10-CM

## 2022-06-10 DIAGNOSIS — Z13220 Encounter for screening for lipoid disorders: Secondary | ICD-10-CM

## 2022-06-10 DIAGNOSIS — R739 Hyperglycemia, unspecified: Secondary | ICD-10-CM

## 2022-06-10 MED ORDER — ATORVASTATIN CALCIUM 40 MG PO TABS: 40.0000 mg | ORAL_TABLET | Freq: Every day | ORAL | 0 refills | Status: DC

## 2022-06-17 ENCOUNTER — Ambulatory Visit: Payer: Self-pay | Admitting: Nurse Practitioner

## 2022-11-13 ENCOUNTER — Other Ambulatory Visit: Payer: Self-pay | Admitting: Family Medicine

## 2022-11-13 DIAGNOSIS — Z13220 Encounter for screening for lipoid disorders: Secondary | ICD-10-CM

## 2022-11-13 DIAGNOSIS — I152 Hypertension secondary to endocrine disorders: Secondary | ICD-10-CM

## 2022-11-13 DIAGNOSIS — R739 Hyperglycemia, unspecified: Secondary | ICD-10-CM

## 2023-02-04 ENCOUNTER — Other Ambulatory Visit: Payer: Self-pay

## 2023-02-13 ENCOUNTER — Other Ambulatory Visit: Payer: Self-pay

## 2023-02-13 DIAGNOSIS — Z13220 Encounter for screening for lipoid disorders: Secondary | ICD-10-CM

## 2023-02-13 DIAGNOSIS — I152 Hypertension secondary to endocrine disorders: Secondary | ICD-10-CM

## 2023-02-13 DIAGNOSIS — R739 Hyperglycemia, unspecified: Secondary | ICD-10-CM

## 2023-02-14 ENCOUNTER — Other Ambulatory Visit: Payer: Self-pay | Admitting: Family Medicine

## 2023-02-14 DIAGNOSIS — E1159 Type 2 diabetes mellitus with other circulatory complications: Secondary | ICD-10-CM

## 2023-02-14 DIAGNOSIS — Z13220 Encounter for screening for lipoid disorders: Secondary | ICD-10-CM

## 2023-02-14 DIAGNOSIS — R739 Hyperglycemia, unspecified: Secondary | ICD-10-CM

## 2023-03-27 ENCOUNTER — Telehealth: Payer: Self-pay

## 2023-03-27 ENCOUNTER — Other Ambulatory Visit: Payer: Self-pay | Admitting: Family Medicine

## 2023-03-27 DIAGNOSIS — E1165 Type 2 diabetes mellitus with hyperglycemia: Secondary | ICD-10-CM

## 2023-03-27 DIAGNOSIS — R739 Hyperglycemia, unspecified: Secondary | ICD-10-CM

## 2023-03-27 DIAGNOSIS — N400 Enlarged prostate without lower urinary tract symptoms: Secondary | ICD-10-CM

## 2023-03-27 DIAGNOSIS — Z13220 Encounter for screening for lipoid disorders: Secondary | ICD-10-CM

## 2023-03-27 DIAGNOSIS — E1159 Type 2 diabetes mellitus with other circulatory complications: Secondary | ICD-10-CM

## 2023-03-27 DIAGNOSIS — I10 Essential (primary) hypertension: Secondary | ICD-10-CM

## 2023-03-27 MED ORDER — PIOGLITAZONE HCL 15 MG PO TABS: 15.0000 mg | ORAL_TABLET | Freq: Every day | ORAL | 0 refills | Status: DC

## 2023-03-27 MED ORDER — ATORVASTATIN CALCIUM 40 MG PO TABS: 40.0000 mg | ORAL_TABLET | Freq: Every day | ORAL | 0 refills | Status: DC

## 2023-03-27 MED ORDER — EMPAGLIFLOZIN 25 MG PO TABS: 25.0000 mg | ORAL_TABLET | Freq: Every day | ORAL | 0 refills | Status: DC

## 2023-03-27 MED ORDER — LISINOPRIL 40 MG PO TABS: 40.0000 mg | ORAL_TABLET | Freq: Every day | ORAL | 0 refills | Status: DC

## 2023-03-27 MED ORDER — TAMSULOSIN HCL 0.4 MG PO CAPS: ORAL_CAPSULE | ORAL | 0 refills | Status: DC

## 2023-03-27 NOTE — Telephone Encounter (Signed)
Copied from CRM [id number]. Topic: Clinical - Prescription Issue >> Mar 25, 2023  1:09 PM Danika B wrote: Reason for CRM: Patient states that a nurse has been refilling his rx since Dr Marina Goodell left the practice. States that recently when he went to pick up his rx the pharmacy didn't have anything and that he needed to contact the practice. Rx was atorvastatin (LIPITOR) 40 MG tablet and tamsulosin (FLOMAX) 0.4 MG CAPS capsule. Patient also has not been seen for over a year and does not have an established doctor. Explained that he can also schedule an appt to begin care with a new doctor at [facility]. Patient was frustrated and said he hasn't had any problems getting his rx before now, but understood next steps may need to be taken. Says he would like to know what's going on with his account and is open to scheduling an appt. Callback [phone]

## 2023-03-27 NOTE — Telephone Encounter (Signed)
Dr. Sedalia Muta, I have this patient scheduled for an appointment with Norwood Levo, FNP on 04/02/23. The patient stated that he has been without his medication for 2 weeks now. Unsure, if medication can be sent in to get him by or if he will need to wait until he is seen. Note: he is self pay

## 2023-03-31 NOTE — Telephone Encounter (Signed)
Patient notified that Per Dr. Sedalia Muta she sent 2 weeks of medicines. Patient notified of appointment date and time.

## 2023-04-01 NOTE — Progress Notes (Unsigned)
Subjective:  Patient ID: Alejandro Zimmerman, male    DOB: March 31, 1966  Age: 57 y.o. MRN: [id number]  Chief Complaint  Patient presents with   Medical Management of Chronic Issues    HPI   Pt presents for follow-up of type 2 DM, hypertension, and hyperlipidemia.    Diabetes Mellitus Type II, Follow-up  Last seen for diabetes 1 year ago.  Management  includes Metformin 1000 mg BID, Actos 15 mg daily. Quit taking metformin 3 weeks ago due to side effects. He reports poor compliance with treatment. He is having side effects. Fatigue and Diarrhea  Patient states when he sleeps on his left or right shoulder at times his fingers tip go numb, also stated that sometimes when he tries to open a bottle if feels like needles are sticking his hands. At times has numbness and tingling in his big toe.  Home blood sugar records: fasting range: 179-320  Most Recent Eye Exam: overdue Current exercise: yard work Current diet habits: in general, a "healthy" diet    Lipid/Cholesterol, Follow-up   He was last seen for this 1 year ago.  Management includes lipitor 40 mg daily. He reports excellent compliance with treatment. He is not having side effects.   Hypertension, follow-up: He was last seen for hypertension 1 year ago.  BP at that visit was  Management includes lisinopril 20 mg.          He reports excellent compliance with treatment. He is not having side effects.  He is following a Regular diet.     03/06/2020    9:18 AM 03/06/2020    8:43 AM 02/22/2016    2:21 PM 01/25/2016   11:24 AM  Depression screen PHQ 2/9  Decreased Interest 2 1 0 0  Down, Depressed, Hopeless 3 3 0 0  PHQ - 2 Score 5 4 0 0  Altered sleeping 1     Tired, decreased energy 2     Change in appetite 1     Feeling bad or failure about yourself  2     Trouble concentrating 1     Moving slowly or fidgety/restless 0     Suicidal thoughts 1     PHQ-9 Score 13     Difficult doing work/chores Not difficult at all            03/06/2020    4:36 PM  Fall Risk   Falls in the past year? 0  Number falls in past yr: 0  Injury with Fall? 0  Follow up Falls evaluation completed    Patient Care Team: Abigail Miyamoto, MD (Inactive) as PCP - General (Family Medicine)   Review of Systems  Current Outpatient Medications on File Prior to Visit  Medication Sig Dispense Refill   atorvastatin (LIPITOR) 40 MG tablet Take 1 tablet (40 mg total) by mouth daily. 14 tablet 0   empagliflozin (JARDIANCE) 25 MG TABS tablet Take 1 tablet (25 mg total) by mouth daily before breakfast. 14 tablet 0   lisinopril (ZESTRIL) 40 MG tablet Take 1 tablet (40 mg total) by mouth daily. 14 tablet 0   Multiple Vitamins-Minerals (MULTIVITAMIN WITH MINERALS) tablet Take 1 tablet by mouth daily.     pioglitazone (ACTOS) 15 MG tablet Take 1 tablet (15 mg total) by mouth daily. 14 tablet 0   tamsulosin (FLOMAX) 0.4 MG CAPS capsule TAKE 1 CAPSULE BY MOUTH ONCE DAILY AFTERSUPPER 14 capsule 0   No current facility-administered medications on file prior to  visit.   Past Medical History:  Diagnosis Date   Hyperglycemia 03/06/2020   Hypertension associated with diabetes (HCC)    Kidney stones    Obesity 02/02/2014   Past Surgical History:  Procedure Laterality Date   hiatal hernia  1998   left stent with stone removal Left 12/04/2014   [facility]    Family History  Problem Relation Age of Onset   Prostate cancer Father    Social History   Socioeconomic History   Marital status: Divorced    Spouse name: Not on file   Number of children: 1   Years of education: Not on file   Highest education level: Not on file  Occupational History   Not on file  Tobacco Use   Smoking status: Never   Smokeless tobacco: Never  Substance and Sexual Activity   Alcohol use: Yes    Comment: socially    Drug use: Never   Sexual activity: Yes  Other Topics Concern   Not on file  Social History Narrative   Not on file    Social Determinants of Health   Financial Resource Strain: Not on file  Food Insecurity: Not on file  Transportation Needs: Not on file  Physical Activity: Not on file  Stress: Not on file  Social Connections: Unknown (08/31/2021)   [facility] from [facility], [facility]   Social Network    Social Network: Not on file    Objective:  There were no vitals taken for this visit.     03/01/2022   11:22 AM 03/01/2022   11:13 AM 08/14/2021    1:54 PM  BP/Weight  Systolic BP 152 148 140  Diastolic BP 90 98 82  Wt. (Lbs)  247 243.8  BMI  32.59 kg/m2 32.17 kg/m2    Physical Exam  Diabetic Foot Exam - Simple   No data filed      Lab Results  Component Value Date   WBC 9.1 08/14/2021   HGB 16.9 08/14/2021   HCT 49.2 08/14/2021   PLT 199 08/14/2021   GLUCOSE 208 (H) 08/14/2021   CHOL 133 08/14/2021   TRIG 46 08/14/2021   HDL 67 08/14/2021   LDLCALC 55 08/14/2021   ALT 22 08/14/2021   AST 20 08/14/2021   NA 138 08/14/2021   K 4.1 08/14/2021   CL 103 08/14/2021   CREATININE 0.97 08/14/2021   BUN 10 08/14/2021   CO2 22 08/14/2021   HGBA1C 10.5 (H) 08/14/2021   MICROALBUR 80 03/06/2020      Assessment & Plan:    There are no diagnoses linked to this encounter.   No orders of the defined types were placed in this encounter.   No orders of the defined types were placed in this encounter.    Follow-up: No follow-ups on file.   Clayborn Bigness I Leal-Borjas,acting as a scribe for Renne Crigler, FNP.,have documented all relevant documentation on the behalf of Renne Crigler, FNP,as directed by  Renne Crigler, FNP while in the presence of Renne Crigler, FNP.   An After Visit Summary was printed and given to the patient.  Renne Crigler, FNP [facility] [phone]

## 2023-04-02 ENCOUNTER — Encounter: Payer: Self-pay | Admitting: Family Medicine

## 2023-04-02 ENCOUNTER — Ambulatory Visit: Payer: Self-pay | Admitting: Family Medicine

## 2023-04-02 VITALS — BP 138/80 | HR 72 | Temp 97.6°F | Resp 16 | Ht 73.0 in | Wt 250.8 lb

## 2023-04-02 DIAGNOSIS — E1169 Type 2 diabetes mellitus with other specified complication: Secondary | ICD-10-CM

## 2023-04-02 DIAGNOSIS — N400 Enlarged prostate without lower urinary tract symptoms: Secondary | ICD-10-CM | POA: Insufficient documentation

## 2023-04-02 DIAGNOSIS — Z13220 Encounter for screening for lipoid disorders: Secondary | ICD-10-CM

## 2023-04-02 DIAGNOSIS — E1159 Type 2 diabetes mellitus with other circulatory complications: Secondary | ICD-10-CM

## 2023-04-02 DIAGNOSIS — F321 Major depressive disorder, single episode, moderate: Secondary | ICD-10-CM | POA: Insufficient documentation

## 2023-04-02 DIAGNOSIS — E1165 Type 2 diabetes mellitus with hyperglycemia: Secondary | ICD-10-CM

## 2023-04-02 DIAGNOSIS — E785 Hyperlipidemia, unspecified: Secondary | ICD-10-CM

## 2023-04-02 DIAGNOSIS — I152 Hypertension secondary to endocrine disorders: Secondary | ICD-10-CM

## 2023-04-02 MED ORDER — BUPROPION HCL ER (XL) 150 MG PO TB24: 150.0000 mg | ORAL_TABLET | Freq: Every day | ORAL | 0 refills | Status: DC

## 2023-04-02 MED ORDER — PHENTERMINE HCL 37.5 MG PO CAPS: 37.5000 mg | ORAL_CAPSULE | ORAL | 0 refills | Status: DC

## 2023-04-02 MED ORDER — PIOGLITAZONE HCL 15 MG PO TABS: 15.0000 mg | ORAL_TABLET | Freq: Every day | ORAL | 0 refills | Status: DC

## 2023-04-02 MED ORDER — ATORVASTATIN CALCIUM 40 MG PO TABS: 40.0000 mg | ORAL_TABLET | Freq: Every day | ORAL | 0 refills | Status: DC

## 2023-04-02 MED ORDER — TAMSULOSIN HCL 0.4 MG PO CAPS: ORAL_CAPSULE | ORAL | 0 refills | Status: DC

## 2023-04-02 NOTE — Assessment & Plan Note (Signed)
Flowsheet Row Office Visit from 04/02/2023 in [facility]  PHQ-9 Total Score 14      - started patient on Wellbutrin ER 150 mg by mouth  once daily FU in 1 month

## 2023-04-02 NOTE — Assessment & Plan Note (Addendum)
Not at goal. BMI 33.09 - Phentermine 37.5 mg once daily in the morning. Fu in 1 month

## 2023-04-02 NOTE — Assessment & Plan Note (Signed)
Labs drawn today Await labs/testing for assessment and recommendations.

## 2023-04-02 NOTE — Patient Instructions (Signed)
Aim to do some physical activity for 150 minutes per week. This is typically divided into 5 days per week, 30 minutes per day. The activity should be enough to get your heart rate up. Anything is better than nothing if you have time constraints.    Mediterranean Diet A Mediterranean diet is based on the traditions of countries on the [employer]. It focuses on eating more: Fruits and vegetables. Whole grains, beans, nuts, and seeds. Heart-healthy fats. These are fats that are good for your heart. It involves eating less: Dairy. Meat and eggs. Processed foods with added sugar, salt, and fat. This type of diet can help prevent certain conditions. It can also improve outcomes if you have a long-term (chronic) disease, such as kidney or heart disease. What are tips for following this plan? Reading food labels Check packaged foods for: The serving size. For foods such as rice and pasta, the serving size is the amount of cooked product, not dry. The total fat. Avoid foods with saturated fat or trans fat. Added sugars, such as corn syrup. Shopping  Try to have a balanced diet. Buy a variety of foods, such as: Fresh fruits and vegetables. You may be able to get these from local farmers markets. You can also buy them frozen. Grains, beans, nuts, and seeds. Some of these can be bought in bulk. Fresh seafood. Poultry and eggs. Low-fat dairy products. Buy whole ingredients instead of foods that have already been packaged. If you can't get fresh seafood, buy precooked frozen shrimp or canned fish, such as tuna, salmon, or sardines. Stock your pantry so you always have certain foods on hand, such as olive oil, canned tuna, canned tomatoes, rice, pasta, and beans. Cooking Cook foods with extra-virgin olive oil instead of using butter or other vegetable oils. Have meat as a side dish. Have vegetables or grains as your main dish. This means having meat in small portions or adding small amounts of  meat to foods like pasta or stew. Use beans or vegetables instead of meat in common dishes like chili or lasagna. Try out different cooking methods. Try roasting, broiling, steaming, and sauting vegetables. Add frozen vegetables to soups, stews, pasta, or rice. Add nuts or seeds for added healthy fats and plant protein at each meal. You can add these to yogurt, salads, or vegetable dishes. Marinate fish or vegetables using olive oil, lemon juice, garlic, and fresh herbs. Meal planning Plan to eat a vegetarian meal one day each week. Try to work up to two vegetarian meals, if possible. Eat seafood two or more times a week. Have healthy snacks on hand. These may include: Vegetable sticks with hummus. Greek yogurt. Fruit and nut trail mix. Eat balanced meals. These should include: Fruit: 2-3 servings a day. Vegetables: 4-5 servings a day. Low-fat dairy: 2 servings a day. Fish, poultry, or lean meat: 1 serving a day. Beans and legumes: 2 or more servings a week. Nuts and seeds: 1-2 servings a day. Whole grains: 6-8 servings a day. Extra-virgin olive oil: 3-4 servings a day. Limit red meat and sweets to just a few servings a month. Lifestyle  Try to cook and eat meals with your family. Drink enough fluid to keep your pee (urine) pale yellow. Be active every day. This includes: Aerobic exercise, which is exercise that causes your heart to beat faster. Examples include running and swimming. Leisure activities like gardening, walking, or housework. Get 7-8 hours of sleep each night. Drink red wine if your provider  says you can. A glass of wine is 5 oz (150 mL). You may be allowed to have: Up to 1 glass a day if you're male and not pregnant. Up to 2 glasses a day if you're male. What foods should I eat? Fruits Apples. Apricots. Avocado. Berries. Bananas. Cherries. Dates. Figs. Grapes. Lemons. Melon. Oranges. Peaches. Plums. Pomegranate. Vegetables Artichokes. Beets. Broccoli. Cabbage.  Carrots. Eggplant. Green beans. Chard. Kale. Spinach. Onions. Leeks. Peas. Squash. Tomatoes. Peppers. Radishes. Grains Whole-grain pasta. Brown rice. Bulgur wheat. Polenta. Couscous. Whole-wheat bread. Orpah Cobb. Meats and other proteins Beans. Almonds. Sunflower seeds. Pine nuts. Peanuts. Cod. Salmon. Scallops. Shrimp. Tuna. Tilapia. Clams. Oysters. Eggs. Chicken or [place] without skin. Dairy Low-fat milk. Cheese. Greek yogurt. Fats and oils Extra-virgin olive oil. Avocado oil. Grapeseed oil. Beverages Water. Red wine. Herbal tea. Sweets and desserts Greek yogurt with honey. Baked apples. Poached pears. Trail mix. Seasonings and condiments Basil. Cilantro. Coriander. Cumin. Mint. Parsley. Sage. Rosemary. Tarragon. Garlic. Oregano. Thyme. Pepper. Balsamic vinegar. Tahini. Hummus. Tomato sauce. Olives. Mushrooms. The items listed above may not be all the foods and drinks you can have. Talk to a dietitian to learn more. What foods should I limit? This is a list of foods that should be eaten rarely. Fruits Fruit canned in syrup. Vegetables Deep-fried potatoes, like [place] fries. Grains Packaged pasta or rice dishes. Cereal with added sugar. Snacks with added sugar. Meats and other proteins Beef. Pork. Lamb. Chicken or [place] with skin. Hot dogs. Tomasa Blase. Dairy Ice cream. Sour cream. Whole milk. Fats and oils Butter. Canola oil. Vegetable oil. Beef fat (tallow). Lard. Beverages Juice. Sugar-sweetened soft drinks. Beer. Liquor and spirits. Sweets and desserts Cookies. Cakes. Pies. Candy. Seasonings and condiments Mayonnaise. Pre-made sauces and marinades. The items listed above may not be all the foods and drinks you should limit. Talk to a dietitian to learn more. Where to find more information American Heart Association (AHA): heart.org This information is not intended to replace advice given to you by your health care provider. Make sure you discuss any questions you have  with your health care provider. Document Revised: 07/21/2022 Document Reviewed: 07/21/2022 Elsevier Patient Education  2024 [employer].

## 2023-04-02 NOTE — Assessment & Plan Note (Signed)
Not well controlled (200's) - Patient not currently taking any medication at this time. - Actos 15 mg by mouth once daily - Need to schedule eye exam - Continue to do daily foot exams - Labs drawn today, Await labs/testing for assessment and recommendations  Fu in 1 month

## 2023-04-02 NOTE — Assessment & Plan Note (Signed)
Well controlled - Continue on current medication management, Lipitor 40 mg  by mouth once daily - labs drawn today, Await labs/testing for assessment and recommendations  FU in 1 month

## 2023-04-02 NOTE — Assessment & Plan Note (Signed)
BP today 138/80, improved from last visit - Continue current medication management - Lisinopril 20 mg Labs drawn today FU in 1 month

## 2023-04-03 LAB — CBC WITH DIFFERENTIAL/PLATELET
Basophils Absolute: 0 10*3/uL (ref 0.0–0.2)
Basos: 0 %
EOS (ABSOLUTE): 0.2 10*3/uL (ref 0.0–0.4)
Eos: 3 %
Hematocrit: 49.9 % (ref 37.5–51.0)
Hemoglobin: 16.7 g/dL (ref 13.0–17.7)
Immature Grans (Abs): 0 10*3/uL (ref 0.0–0.1)
Immature Granulocytes: 0 %
Lymphocytes Absolute: 2.5 10*3/uL (ref 0.7–3.1)
Lymphs: 32 %
MCH: 30.1 pg (ref 26.6–33.0)
MCHC: 33.5 g/dL (ref 31.5–35.7)
MCV: 90 fL (ref 79–97)
Monocytes Absolute: 0.8 10*3/uL (ref 0.1–0.9)
Monocytes: 11 %
Neutrophils Absolute: 4.1 10*3/uL (ref 1.4–7.0)
Neutrophils: 54 %
Platelets: 204 10*3/uL (ref 150–450)
RBC: 5.54 x10E6/uL (ref 4.14–5.80)
RDW: 11.2 % — ABNORMAL LOW (ref 11.6–15.4)
WBC: 7.7 10*3/uL (ref 3.4–10.8)

## 2023-04-03 LAB — LIPID PANEL
Chol/HDL Ratio: 3.2 {ratio} (ref 0.0–5.0)
Cholesterol, Total: 230 mg/dL — ABNORMAL HIGH (ref 100–199)
HDL: 72 mg/dL (ref >39)
LDL Chol Calc (NIH): 149 mg/dL — ABNORMAL HIGH (ref 0–99)
Triglycerides: 52 mg/dL (ref 0–149)
VLDL Cholesterol Cal: 9 mg/dL (ref 5–40)

## 2023-04-03 LAB — COMPREHENSIVE METABOLIC PANEL
ALT: 27 [IU]/L (ref 0–44)
AST: 23 [IU]/L (ref 0–40)
Albumin: 4.5 g/dL (ref 3.8–4.9)
Alkaline Phosphatase: 87 [IU]/L (ref 44–121)
BUN/Creatinine Ratio: 10 (ref 9–20)
BUN: 10 mg/dL (ref 6–24)
Bilirubin Total: 0.5 mg/dL (ref 0.0–1.2)
CO2: 24 mmol/L (ref 20–29)
Calcium: 9.3 mg/dL (ref 8.7–10.2)
Chloride: 98 mmol/L (ref 96–106)
Creatinine, Ser: 1 mg/dL (ref 0.76–1.27)
Globulin, Total: 2.8 g/dL (ref 1.5–4.5)
Glucose: 229 mg/dL — ABNORMAL HIGH (ref 70–99)
Potassium: 4 mmol/L (ref 3.5–5.2)
Sodium: 138 mmol/L (ref 134–144)
Total Protein: 7.3 g/dL (ref 6.0–8.5)
eGFR: 87 mL/min/{1.73_m2} (ref >59)

## 2023-04-03 LAB — HEMOGLOBIN A1C
Est. average glucose Bld gHb Est-mCnc: 252 mg/dL
Hgb A1c MFr Bld: 10.4 % — ABNORMAL HIGH (ref 4.8–5.6)

## 2023-04-03 LAB — MICROALBUMIN / CREATININE URINE RATIO
Creatinine, Urine: 192.7 mg/dL
Microalb/Creat Ratio: 48 mg/g{creat} — ABNORMAL HIGH (ref 0–29)
Microalbumin, Urine: 92.5 ug/mL

## 2023-04-03 LAB — PSA: Prostate Specific Ag, Serum: 1 ng/mL (ref 0.0–4.0)

## 2023-04-30 ENCOUNTER — Encounter: Payer: Self-pay | Admitting: Family Medicine

## 2023-05-01 ENCOUNTER — Encounter: Payer: Self-pay | Admitting: Family Medicine

## 2023-05-01 ENCOUNTER — Ambulatory Visit: Payer: Self-pay | Admitting: Family Medicine

## 2023-05-01 VITALS — BP 130/72 | HR 90 | Temp 97.7°F | Resp 16 | Ht 73.0 in | Wt 249.6 lb

## 2023-05-01 DIAGNOSIS — I152 Hypertension secondary to endocrine disorders: Secondary | ICD-10-CM

## 2023-05-01 DIAGNOSIS — E1169 Type 2 diabetes mellitus with other specified complication: Secondary | ICD-10-CM

## 2023-05-01 DIAGNOSIS — E1165 Type 2 diabetes mellitus with hyperglycemia: Secondary | ICD-10-CM

## 2023-05-01 DIAGNOSIS — E785 Hyperlipidemia, unspecified: Secondary | ICD-10-CM

## 2023-05-01 DIAGNOSIS — M79671 Pain in right foot: Secondary | ICD-10-CM

## 2023-05-01 DIAGNOSIS — F321 Major depressive disorder, single episode, moderate: Secondary | ICD-10-CM

## 2023-05-01 DIAGNOSIS — E1159 Type 2 diabetes mellitus with other circulatory complications: Secondary | ICD-10-CM

## 2023-05-01 DIAGNOSIS — Z0000 Encounter for general adult medical examination without abnormal findings: Secondary | ICD-10-CM

## 2023-05-01 MED ORDER — BUPROPION HCL ER (XL) 150 MG PO TB24: 150.0000 mg | ORAL_TABLET | Freq: Every day | ORAL | 3 refills | Status: DC

## 2023-05-01 MED ORDER — ATORVASTATIN CALCIUM 40 MG PO TABS: 40.0000 mg | ORAL_TABLET | Freq: Every day | ORAL | 3 refills | Status: DC

## 2023-05-01 MED ORDER — PHENTERMINE HCL 37.5 MG PO CAPS: 37.5000 mg | ORAL_CAPSULE | ORAL | 0 refills | Status: AC

## 2023-05-01 MED ORDER — METFORMIN HCL ER 500 MG PO TB24: 500.0000 mg | ORAL_TABLET | Freq: Every day | ORAL | 3 refills | Status: DC

## 2023-05-01 MED ORDER — LISINOPRIL 40 MG PO TABS: 40.0000 mg | ORAL_TABLET | Freq: Every day | ORAL | 3 refills | Status: DC

## 2023-05-01 MED ORDER — PIOGLITAZONE HCL 15 MG PO TABS: 15.0000 mg | ORAL_TABLET | Freq: Every day | ORAL | 3 refills | Status: DC

## 2023-05-01 NOTE — Progress Notes (Signed)
 Subjective:  Patient ID: Alejandro Zimmerman, male    DOB: 1965/07/02  Age: 58 y.o. MRN: [id number]  Chief Complaint  Patient presents with   Annual Exam    HPI  Well Adult Physical: Patient here for a comprehensive physical exam.The patient reports no problems Do you take any herbs or supplements that were not prescribed by a doctor? yes Are you taking calcium  supplements? no Are you taking aspirin daily? no  Encounter for general adult medical examination without abnormal findings  Physical (At Risk items are starred): Patient's last physical exam was 1 year ago .  Patient wears a seat belt, has smoke detectors, has carbon monoxide detectors, practices appropriate gun safety, and wears sunscreen with extended sun exposure. Dental Care: biannual cleanings, brushes and flosses daily. Ophthalmology/Optometry: Annual visit.  Hearing loss: none Vision impairments: none Last PSA: 1.0  Discussed the use of AI scribe software for clinical note transcription with the patient, who gave verbal consent to proceed.   HPI The patient, with a history of diabetes and high cholesterol, presents for a routine follow-up. He reports that his blood sugar levels have been high, with a recent hemoglobin A1c of 10.4, slightly down from 10.5. He has been taking berberine daily for his diabetes, but he expresses that metformin  has previously caused stomach upset. The patient states that unfortunately he is unable to afford the diabetes medication that he needs.  He also mentions a sore on his right foot, specifically on the inside of his big toe, which he has noticed but does not cause significant discomfort.  In addition to his diabetes, the patient's cholesterol levels have been elevated, with a recent LDL of 149, up from 55. He admits to having stopped his cholesterol medication, which he has since restarted.   The patient reports that his depression is much better after starting the Wellbutrin  150 ER mg once  daily. Patient denies any side effects from this medication.  The patient also mentions some visual difficulties, particularly when driving at night. He is due for an eye exam and new glasses. He has not noticed any other significant symptoms, such as chest pain, shortness of breath, or significant changes in bowel habits.       02/22/2016    2:21 PM 03/06/2020    4:36 PM 04/02/2023    8:03 AM  Fall Risk  Falls in the past year? No 0 0  Was there an injury with Fall?  0 0  Fall Risk Category Calculator  0 0  Fall Risk Category (Retired)  Low   (RETIRED) Patient Fall Risk Level  Low fall risk   Patient at Risk for Falls Due to   No Fall Risks  Fall risk Follow up  Falls evaluation completed Falls evaluation completed           Past Medical History:  Diagnosis Date   Hyperglycemia 03/06/2020   Hypertension associated with diabetes (HCC)    Kidney stones    Obesity 02/02/2014   Past Surgical History:  Procedure Laterality Date   hiatal hernia  1998   left stent with stone removal Left 12/04/2014   [facility]    Family History  Problem Relation Age of Onset   Prostate cancer Father    Social History   Socioeconomic History   Marital status: Divorced    Spouse name: Not on file   Number of children: 1   Years of education: Not on file   Highest education level: Not  on file  Occupational History   Not on file  Tobacco Use   Smoking status: Never   Smokeless tobacco: Never  Substance and Sexual Activity   Alcohol use: Yes    Comment: socially    Drug use: Never   Sexual activity: Yes  Other Topics Concern   Not on file  Social History Narrative   Not on file   Social Drivers of Health   Financial Resource Strain: Low Risk  (05/01/2023)   Overall Financial Resource Strain (CARDIA)    Difficulty of Paying Living Expenses: Not very hard  Food Insecurity: No Food Insecurity (05/01/2023)   Hunger Vital Sign    Worried About Running Out of Food in the Last  Year: Never true    Ran Out of Food in the Last Year: Never true  Transportation Needs: No Transportation Needs (05/01/2023)   PRAPARE - Administrator, Civil Service (Medical): No    Lack of Transportation (Non-Medical): No  Physical Activity: Insufficiently Active (05/01/2023)   Exercise Vital Sign    Days of Exercise per Week: 1 day    Minutes of Exercise per Session: 60 min  Stress: Stress Concern Present (05/01/2023)   [employer] of Occupational Health - Occupational Stress Questionnaire    Feeling of Stress : Very much  Social Connections: Socially Integrated (05/01/2023)   Social Connection and Isolation Panel [NHANES]    Frequency of Communication with Friends and Family: More than three times a week    Frequency of Social Gatherings with Friends and Family: Once a week    Attends Religious Services: More than 4 times per year    Active Member of [employer] or Organizations: Yes    Attends Engineer, Structural: More than 4 times per year    Marital Status: Married   Review of Systems  Constitutional:  Negative for chills, diaphoresis, fatigue and fever.  HENT:  Negative for congestion, ear pain and sinus pain.   Respiratory:  Negative for cough and shortness of breath.   Cardiovascular:  Negative for chest pain.  Gastrointestinal:  Positive for constipation. Negative for abdominal pain, nausea and vomiting.  Genitourinary:  Negative for dysuria.  Musculoskeletal:  Positive for arthralgias (right toe pain).  Neurological:  Negative for weakness and headaches.  Psychiatric/Behavioral:  Negative for dysphoric mood. The patient is not nervous/anxious.      Objective:  BP 130/72 (BP Location: Left Arm, Patient Position: Sitting, Cuff Size: Large)   Pulse 90   Temp 97.7 F (36.5 C) (Temporal)   Resp 16   Ht 6' 1 (1.854 m)   Wt 249 lb 9.6 oz (113.2 kg)   SpO2 98%   BMI 32.93 kg/m      05/01/2023    8:36 AM 04/02/2023    7:59 AM 03/01/2022   11:22 AM   BP/Weight  Systolic BP 130 138 152  Diastolic BP 72 80 90  Wt. (Lbs) 249.6 250.8   BMI 32.93 kg/m2 33.09 kg/m2     Physical Exam Vitals reviewed.  Constitutional:      General: He is not in acute distress.    Appearance: Normal appearance. He is well-groomed. He is obese.  HENT:     Head: Normocephalic.     Right Ear: Tympanic membrane, ear canal and external ear normal.     Left Ear: Tympanic membrane, ear canal and external ear normal.     Nose: Nose normal.     Mouth/Throat:  Mouth: Mucous membranes are moist.     Pharynx: Oropharynx is clear. No posterior oropharyngeal erythema.  Eyes:     Extraocular Movements: Extraocular movements intact.     Conjunctiva/sclera: Conjunctivae normal.     Pupils: Pupils are equal, round, and reactive to light.  Neck:     Vascular: No carotid bruit.  Cardiovascular:     Rate and Rhythm: Normal rate and regular rhythm.     Pulses: Normal pulses.     Heart sounds: Normal heart sounds. No murmur heard. Pulmonary:     Effort: Pulmonary effort is normal.     Breath sounds: Normal breath sounds. No wheezing.  Abdominal:     General: Bowel sounds are normal.     Palpations: Abdomen is soft.     Tenderness: There is guarding (tender from previous surgery w mesh).  Musculoskeletal:        General: Normal range of motion.     Cervical back: Normal range of motion and neck supple.  Skin:    General: Skin is warm and dry.  Neurological:     Mental Status: He is alert and oriented to person, place, and time. Mental status is at baseline.     Cranial Nerves: Cranial nerves 2-12 are intact.     Motor: Motor function is intact. No weakness.     Coordination: Coordination is intact.     Gait: Gait is intact.     Deep Tendon Reflexes: Reflexes are normal and symmetric.  Psychiatric:        Attention and Perception: Attention normal.        Mood and Affect: Mood normal.        Speech: Speech normal.        Behavior: Behavior normal.         Thought Content: Thought content normal.        Judgment: Judgment normal.     Lab Results  Component Value Date   WBC 7.7 04/02/2023   HGB 16.7 04/02/2023   HCT 49.9 04/02/2023   PLT 204 04/02/2023   GLUCOSE 229 (H) 04/02/2023   CHOL 230 (H) 04/02/2023   TRIG 52 04/02/2023   HDL 72 04/02/2023   LDLCALC 149 (H) 04/02/2023   ALT 27 04/02/2023   AST 23 04/02/2023   NA 138 04/02/2023   K 4.0 04/02/2023   CL 98 04/02/2023   CREATININE 1.00 04/02/2023   BUN 10 04/02/2023   CO2 24 04/02/2023   HGBA1C 10.4 (H) 04/02/2023   MICROALBUR 80 03/06/2020      Assessment & Plan:  Annual physical exam Assessment & Plan: -Encourage patient to have regular eye exams, especially given the diagnosis of diabetes. -Advise patient to consider colonoscopy once insurance is obtained. FU in 1 year   Depression, major, single episode, moderate (HCC) Assessment & Plan: Improved - Continue Wellbutrin  ER 150 mg by mouth  once daily FU in 3-4 month  Orders: -     buPROPion  HCl ER (XL); Take 1 tablet (150 mg total) by mouth daily.  Dispense: 90 tablet; Refill: 3  Morbid obesity (HCC) Assessment & Plan: Not at goal. BMI 32.93 - Phentermine  37.5 mg once daily in the morning. Fu in 3-4 months  Orders: -     Phentermine  HCl; Take 1 capsule (37.5 mg total) by mouth every morning.  Dispense: 30 capsule; Refill: 0  Foot pain, right Assessment & Plan: Patient reports pain in right toe. No signs of infection or ulceration noted on examination. -  Monitor for changes and report any worsening of symptoms.   Hyperlipidemia associated with type 2 diabetes mellitus (HCC) Assessment & Plan: Chronic Lab Results  Component Value Date   LDLCALC 149 (H) 04/02/2023  LDL elevated at 149, total cholesterol 200. Patient had stopped taking medication but has since restarted. -Continue current lipid-lowering medication. -Recheck lipid profile at next visit.  Orders: -     Atorvastatin  Calcium ; Take 1  tablet (40 mg total) by mouth daily.  Dispense: 90 tablet; Refill: 3  Uncontrolled type 2 diabetes mellitus with hyperglycemia (HCC) Assessment & Plan: Chronic Poorly controlled with HbA1c of 10.4 and glucose of 229. Patient has difficulty affording medications. Currently on Berberine daily. Patient has previously experienced gastrointestinal side effects with Metformin  1000 mg TWICE A DAY. -Start lower dose Metformin  extended release 500mg  daily to improve glycemic control. -Check HbA1c and glucose levels at next visit.  Orders: -     metFORMIN  HCl ER; Take 1 tablet (500 mg total) by mouth daily with breakfast.  Dispense: 30 tablet; Refill: 3 -     Pioglitazone  HCl; Take 1 tablet (15 mg total) by mouth daily.  Dispense: 90 tablet; Refill: 3  Hypertension associated with diabetes (HCC) Assessment & Plan: Chronic BP Readings from Last 3 Encounters:  05/01/23 130/72  04/02/23 138/80  03/01/22 (!) 152/90    BP today 130/72, improved from last visit - Continue current medication management - Lisinopril  20 mg Labs drawn today FU in 3-4 months   Orders: -     Lisinopril ; Take 1 tablet (40 mg total) by mouth daily.  Dispense: 90 tablet; Refill: 3     Body mass index is 32.93 kg/m.   These are the goals we discussed:  Goals      Set My Weight Loss Goal     Follow Up Date 05/01/23    - set weight loss goal    Why is this important?  To help lower A1C and cholesterol Losing only 5 to 15 percent of your weight makes a big difference in your health.    Notes:    Aim to do some physical activity for 150 minutes per week. This is typically divided into 5 days per week, 30 minutes per day. The activity should be enough to get your heart rate up. Anything is better than nothing if you have time constraints.            This is a list of the screening recommended for you and due dates:  Health Maintenance  Topic Date Due   Eye exam for diabetics  Never done   HIV Screening   Never done   Hepatitis C Screening  Never done   Complete foot exam   03/06/2021   Colon Cancer Screening  02/14/2023   COVID-19 Vaccine (1 - 2024-25 season) 05/17/2023*   Flu Shot  07/21/2023*   Zoster (Shingles) Vaccine (1 of 2) 07/30/2023*   DTaP/Tdap/Td vaccine (1 - Tdap) 04/30/2024*   Hemoglobin A1C  10/01/2023   Yearly kidney function blood test for diabetes  04/01/2024   Yearly kidney health urinalysis for diabetes  04/01/2024   HPV Vaccine  Aged Out  *Topic was postponed. The date shown is not the original due date.     Meds ordered this encounter  Medications   metFORMIN  (GLUCOPHAGE -XR) 500 MG 24 hr tablet    Sig: Take 1 tablet (500 mg total) by mouth daily with breakfast.    Dispense:  30 tablet  Refill:  3   buPROPion  (WELLBUTRIN  XL) 150 MG 24 hr tablet    Sig: Take 1 tablet (150 mg total) by mouth daily.    Dispense:  90 tablet    Refill:  3   phentermine  37.5 MG capsule    Sig: Take 1 capsule (37.5 mg total) by mouth every morning.    Dispense:  30 capsule    Refill:  0   atorvastatin  (LIPITOR) 40 MG tablet    Sig: Take 1 tablet (40 mg total) by mouth daily.    Dispense:  90 tablet    Refill:  3    Patient needs an appointment with provider.   pioglitazone  (ACTOS ) 15 MG tablet    Sig: Take 1 tablet (15 mg total) by mouth daily.    Dispense:  90 tablet    Refill:  3   lisinopril  (ZESTRIL ) 40 MG tablet    Sig: Take 1 tablet (40 mg total) by mouth daily.    Dispense:  90 tablet    Refill:  3     Follow-up: Return in about 3 months (around 07/30/2023) for chronic.  An After Visit Summary was printed and given to the patient.  Harrie Cedar, FNP [facility] [phone]

## 2023-05-01 NOTE — Assessment & Plan Note (Addendum)
 Chronic Poorly controlled with HbA1c of 10.4 and glucose of 229. Patient has difficulty affording medications. Currently on Berberine daily. Patient has previously experienced gastrointestinal side effects with Metformin  1000 mg TWICE A DAY. -Start lower dose Metformin  extended release 500mg  daily to improve glycemic control. -Check HbA1c and glucose levels at next visit.

## 2023-05-01 NOTE — Assessment & Plan Note (Signed)
 Improved - Continue Wellbutrin ER 150 mg by mouth  once daily FU in 3-4 month

## 2023-05-01 NOTE — Assessment & Plan Note (Signed)
 Not at goal. BMI 32.93 - Phentermine 37.5 mg once daily in the morning. Fu in 3-4 months

## 2023-05-01 NOTE — Assessment & Plan Note (Addendum)
-  Encourage patient to have regular eye exams, especially given the diagnosis of diabetes. -Advise patient to consider colonoscopy once insurance is obtained. FU in 1 year

## 2023-05-01 NOTE — Assessment & Plan Note (Signed)
 Chronic BP Readings from Last 3 Encounters:  05/01/23 130/72  04/02/23 138/80  03/01/22 (!) 152/90    BP today 130/72, improved from last visit - Continue current medication management - Lisinopril 20 mg Labs drawn today FU in 3-4 months

## 2023-05-01 NOTE — Assessment & Plan Note (Signed)
 Patient reports pain in right toe. No signs of infection or ulceration noted on examination. -Monitor for changes and report any worsening of symptoms.

## 2023-05-01 NOTE — Assessment & Plan Note (Addendum)
 Chronic Lab Results  Component Value Date   LDLCALC 149 (H) 04/02/2023  LDL elevated at 149, total cholesterol 200. Patient had stopped taking medication but has since restarted. -Continue current lipid-lowering medication. -Recheck lipid profile at next visit.

## 2023-07-15 ENCOUNTER — Ambulatory Visit (INDEPENDENT_AMBULATORY_CARE_PROVIDER_SITE_OTHER): Payer: Self-pay | Admitting: Family Medicine

## 2023-07-15 ENCOUNTER — Ambulatory Visit: Payer: Self-pay | Admitting: *Deleted

## 2023-07-15 ENCOUNTER — Encounter: Payer: Self-pay | Admitting: Family Medicine

## 2023-07-15 VITALS — BP 120/64 | HR 78 | Temp 98.6°F | Resp 16 | Ht 73.0 in | Wt 241.0 lb

## 2023-07-15 DIAGNOSIS — E1165 Type 2 diabetes mellitus with hyperglycemia: Secondary | ICD-10-CM

## 2023-07-15 DIAGNOSIS — E785 Hyperlipidemia, unspecified: Secondary | ICD-10-CM

## 2023-07-15 DIAGNOSIS — E1169 Type 2 diabetes mellitus with other specified complication: Secondary | ICD-10-CM

## 2023-07-15 DIAGNOSIS — R002 Palpitations: Secondary | ICD-10-CM | POA: Insufficient documentation

## 2023-07-15 MED ORDER — METFORMIN HCL ER (MOD) 1000 MG PO TB24: 1000.0000 mg | ORAL_TABLET | Freq: Every day | ORAL | 2 refills | Status: DC

## 2023-07-15 MED ORDER — OZEMPIC (0.25 OR 0.5 MG/DOSE) 2 MG/3ML ~~LOC~~ SOPN: 0.2500 mg | PEN_INJECTOR | SUBCUTANEOUS | Status: AC

## 2023-07-15 NOTE — Assessment & Plan Note (Signed)
 Noted on EKGs from 2021 and current visit. Symptoms include dizziness and palpitations. Previous cardiology referral incomplete due to lack of insurance. - EKG done in office -  ventricular rate 77, PR  - 210, QRS - 90, QTc - 430. No ST elevation or depression, consistent with previous EKG.  - Refer to cardiology for evaluation.

## 2023-07-15 NOTE — Telephone Encounter (Signed)
  Chief Complaint: elevated glucose- fasting 439 Symptoms: Patient reports his glucose had been in 400 range for 1 week now. Patient reports he does have symptoms- dizzy,weakness. 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 Patient does not want to be on insulin due to his job. Patient states he is drinking a lot of water.  Frequency: 1 week- trending up  Disposition: [] ED /[] Urgent Care (no appt availability in office) / [x] Appointment(In office/virtual)/ []  [place] [facility]/ [] Home Care/ [] Refused Recommended Disposition /[] [place] [facility]/ []  Follow-up with PCP Additional Notes: Call to office per protocol- provider has opening to see patient- he has been scheduled.   Copied from CRM [id number]. Topic: Clinical - Red Word Triage >> Jul 15, 2023  1:40 PM Almira Coaster wrote: Red Word that prompted transfer to Nurse Triage: Patient is calling with high sugar levels range in the area of 400-439, and feeling symptoms of passing out and fatigue. Reason for Disposition  Blood glucose > 400 mg/dL (04.5 mmol/L)  Answer Assessment - Initial Assessment Questions 1. BLOOD GLUCOSE: "What is your blood glucose level?"      9 am- 439, fasting 2. ONSET: "When did you check the blood glucose?"     This last week- extremely high 3. USUAL RANGE: "What is your glucose level usually?" (e.g., usual fasting morning value, usual evening value)     200- range 4. KETONES: "Do you check for ketones (urine or blood test strips)?" If Yes, ask: "What does the test show now?"      na 5. TYPE 1 or 2:  "Do you know what type of diabetes you have?"  (e.g., Type 1, Type 2, Gestational; doesn't know)      Type 2 6. INSULIN: "Do you take insulin?" "What type of insulin(s) do you use? What is the mode of delivery? (syringe, pen; injection or pump)?"      no 7. DIABETES PILLS: "Do you take any pills for your diabetes?" If Yes, ask: "Have you missed taking any pills recently?"      Metformin , pioglitazone  8. OTHER SYMPTOMS: "Do you have any symptoms?" (e.g., fever, frequent urination, difficulty breathing, dizziness, weakness, vomiting)     Dizzy, weak  Protocols used: Diabetes - High Blood Sugar-A-AH

## 2023-07-15 NOTE — Progress Notes (Signed)
 Acute Office Visit  Subjective:    Patient ID: Alejandro Zimmerman, male    DOB: 09-24-1965, 58 y.o.   MRN: [id number]  Chief Complaint  Patient presents with   Hyperglycemia    Discussed the use of AI scribe software for clinical note transcription with the patient, who gave verbal consent to proceed.   HPI:  Alejandro Zimmerman is a 58 year old male with uncontrolled diabetes and high cholesterol who presents with symptoms of dizziness, palpitations, and increased thirst.   He has a history of uncontrolled diabetes, with his last A1c recorded at 10.4 in December. He experiences increased thirst, drinking a gallon of water daily, and frequent urination. He is currently taking metformin 500 mg extended release and Actos. He has not been on insulin.   He experiences dizziness and palpitations, particularly when lying down. He has a history of a first-degree AV block noted on an EKG from November 2021. He has not had a recent cardiac workup due to lack of insurance and time constraints. He describes a sensation of being 'outside myself' and sluggishness over the past week. He experienced nausea during dinner the previous day, feeling as though food was 'thick in my throat'. He has had a slight headache for about a week and a half, located between his eyes.  He has a history of high cholesterol, with a previous LDL level of 149. He had missed doses of his cholesterol medication, which may have contributed to the elevated levels.  He experienced visual disturbances such as double vision while on Wellbutrin, which resolved after discontinuation. No current visual disturbances and had an eye exam three weeks ago.  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  Past Medical History:  Diagnosis Date   Hyperglycemia 03/06/2020   Hypertension associated with diabetes (HCC)    Kidney stones    Obesity 02/02/2014    Past  Surgical History:  Procedure Laterality Date   hiatal hernia  1998   left stent with stone removal Left 12/04/2014   [facility]    Family History  Problem Relation Age of Onset   Prostate cancer Father     Social History   Socioeconomic History   Marital status: Married    Spouse name: Not on file   Number of children: 1   Years of education: Not on file   Highest education level: Not on file  Occupational History   Not on file  Tobacco Use   Smoking status: Never   Smokeless tobacco: Never  Substance and Sexual Activity   Alcohol use: Yes    Comment: socially    Drug use: Never   Sexual activity: Yes  Other Topics Concern   Not on file  Social History Narrative   Not on file   Social Drivers of Health   Financial Resource Strain: Low Risk  (05/01/2023)   Overall Financial Resource Strain (CARDIA)    Difficulty of Paying Living Expenses: Not very hard  Food Insecurity: No Food Insecurity (05/01/2023)   Hunger Vital Sign    Worried About Running Out of Food in the Last Year: Never true    Ran Out of Food in the Last Year: Never true  Transportation Needs: No Transportation Needs (05/01/2023)   PRAPARE - Administrator, Civil Service (Medical): No    Lack of Transportation (Non-Medical): No  Physical Activity: Insufficiently Active (05/01/2023)  Exercise Vital Sign    Days of Exercise per Week: 1 day    Minutes of Exercise per Session: 60 min  Stress: Stress Concern Present (05/01/2023)   [employer] of Occupational Health - Occupational Stress Questionnaire    Feeling of Stress : Very much  Social Connections: Socially Integrated (05/01/2023)   Social Connection and Isolation Panel [NHANES]    Frequency of Communication with Friends and Family: More than three times a week    Frequency of Social Gatherings with Friends and Family: Once a week    Attends Religious Services: More than 4 times per year    Active Member of [employer] or Organizations: Yes     Attends Engineer, structural: More than 4 times per year    Marital Status: Married  Catering manager Violence: Not At Risk (05/01/2023)   Humiliation, Afraid, Rape, and Kick questionnaire    Fear of Current or Ex-Partner: No    Emotionally Abused: No    Physically Abused: No    Sexually Abused: No    Outpatient Medications Prior to Visit  Medication Sig Dispense Refill   acidophilus (RISAQUAD) CAPS capsule Take 1 capsule by mouth daily.     atorvastatin (LIPITOR) 40 MG tablet Take 1 tablet (40 mg total) by mouth daily. 90 tablet 3   Berberine Chloride 500 MG CAPS Take 2 each by mouth daily.     lisinopril (ZESTRIL) 40 MG tablet Take 1 tablet (40 mg total) by mouth daily. 90 tablet 3   Multiple Vitamins-Minerals (MULTIVITAMIN WITH MINERALS) tablet Take 1 tablet by mouth daily.     phentermine 37.5 MG capsule Take 1 capsule (37.5 mg total) by mouth every morning. 30 capsule 0   pioglitazone (ACTOS) 15 MG tablet Take 1 tablet (15 mg total) by mouth daily. 90 tablet 3   tamsulosin (FLOMAX) 0.4 MG CAPS capsule TAKE 1 CAPSULE BY MOUTH ONCE DAILY AFTERSUPPER 14 capsule 0   Turmeric 08-998 MG CAPS Take 1 each by mouth daily.     buPROPion (WELLBUTRIN XL) 150 MG 24 hr tablet Take 1 tablet (150 mg total) by mouth daily. (Patient not taking: Reported on 07/15/2023) 90 tablet 3   metFORMIN (GLUCOPHAGE-XR) 500 MG 24 hr tablet Take 1 tablet (500 mg total) by mouth daily with breakfast. 30 tablet 3   No facility-administered medications prior to visit.    Allergies  Allergen Reactions   Ibuprofen Other (See Comments)    Makes kidneys hurt. Makes kidneys hurt.   Metformin And Related Other (See Comments)    GI side effects    Review of Systems  Constitutional:  Positive for appetite change and fatigue. Negative for fever.  HENT:  Negative for congestion, sinus pressure, sneezing and trouble swallowing.   Eyes: Negative.  Negative for visual disturbance.  Respiratory:  Negative for  cough, chest tightness, shortness of breath and wheezing.   Cardiovascular:  Positive for palpitations. Negative for chest pain and leg swelling.  Gastrointestinal:  Positive for constipation and nausea. Negative for abdominal pain, diarrhea and vomiting.  Endocrine: Positive for polydipsia and polyuria. Negative for cold intolerance and heat intolerance.  Genitourinary:  Positive for urgency. Negative for enuresis and hematuria.  Allergic/Immunologic: Negative.   Neurological:  Positive for dizziness, light-headedness and headaches.       Objective:        07/15/2023    4:19 PM 05/01/2023    8:36 AM 04/02/2023    7:59 AM  Vitals with BMI  Height 6'  1" 6\' 1"  6\' 1"   Weight 241 lbs 249 lbs 10 oz 250 lbs 13 oz  BMI 31.8 32.94 33.1  Systolic 120 130 782  Diastolic 64 72 80  Pulse 78 90 72    No data found.   Physical Exam Constitutional:      General: He is not in acute distress.    Appearance: Normal appearance. He is ill-appearing.  Eyes:     Conjunctiva/sclera: Conjunctivae normal.  Cardiovascular:     Rate and Rhythm: Normal rate and regular rhythm.     Heart sounds: Normal heart sounds. No murmur heard. Pulmonary:     Effort: Pulmonary effort is normal.     Breath sounds: Normal breath sounds. No wheezing.  Musculoskeletal:        General: Normal range of motion.  Skin:    General: Skin is warm.  Neurological:     Mental Status: He is alert. Mental status is at baseline.  Psychiatric:        Mood and Affect: Mood normal.        Behavior: Behavior normal.     Health Maintenance Due  Topic Date Due   Pneumococcal Vaccine 43-71 Years old (1 of 2 - PCV) Never done   OPHTHALMOLOGY EXAM  Never done   HIV Screening  Never done   Hepatitis C Screening  Never done   FOOT EXAM  03/06/2021   COVID-19 Vaccine (1 - 2024-25 season) Never done   Colonoscopy  02/14/2023    There are no preventive care reminders to display for this patient.   No results found for:  "TSH" Lab Results  Component Value Date   WBC 7.7 04/02/2023   HGB 16.7 04/02/2023   HCT 49.9 04/02/2023   MCV 90 04/02/2023   PLT 204 04/02/2023   Lab Results  Component Value Date   NA 138 04/02/2023   K 4.0 04/02/2023   CO2 24 04/02/2023   GLUCOSE 229 (H) 04/02/2023   BUN 10 04/02/2023   CREATININE 1.00 04/02/2023   BILITOT 0.5 04/02/2023   ALKPHOS 87 04/02/2023   AST 23 04/02/2023   ALT 27 04/02/2023   PROT 7.3 04/02/2023   ALBUMIN 4.5 04/02/2023   CALCIUM 9.3 04/02/2023   EGFR 87 04/02/2023   Lab Results  Component Value Date   CHOL 230 (H) 04/02/2023   Lab Results  Component Value Date   HDL 72 04/02/2023   Lab Results  Component Value Date   LDLCALC 149 (H) 04/02/2023   Lab Results  Component Value Date   TRIG 52 04/02/2023   Lab Results  Component Value Date   CHOLHDL 3.2 04/02/2023   Lab Results  Component Value Date   HGBA1C 10.4 (H) 04/02/2023       Assessment & Plan:  Uncontrolled type 2 diabetes mellitus with hyperglycemia (HCC) Assessment & Plan: Uncontrolled with A1c of 10.4%. Symptoms include polydipsia, polyuria, nausea, and headaches. Prefers to avoid insulin. Discussed semaglutide for glucose control and symptom relief. Pharmacist Gerilyn Pilgrim to assist with patient assistance application. - Increase metformin ER to 1000 mg once daily. - Initiate Ozempic 0.25 mg SQ once weekly for four weeks, then increase to 0.5 mg. - Provide one-month sample of Ozempic, educated on side effects and administration - FU in 1 week to meet with pharmacist for patient assistance - Order labs to reassess A1c and other parameters.  Orders: -     AMB Referral VBCI Care Management -     metFORMIN HCl ER (  MOD); Take 1 tablet (1,000 mg total) by mouth daily with breakfast.  Dispense: 30 tablet; Refill: 2 -     Ozempic (0.25 or 0.5 MG/DOSE); Inject 0.25 mg into the skin once a week. -     Comprehensive metabolic panel -     Hemoglobin A1c  Hyperlipidemia  associated with type 2 diabetes mellitus (HCC) Assessment & Plan: Above goal of  LDL>60 LDL at 149 mg/dL, above target for diabetics. Previously ran out of medication. - Continue Lipitor 40 mg by mouth once daily @ bedtime. - Labs drawn, Await labs/testing for assessment and recommendations   Orders: -     Lipid panel  Heart palpitations Assessment & Plan: Noted on EKGs from 2021 and current visit. Symptoms include dizziness and palpitations. Previous cardiology referral incomplete due to lack of insurance. - EKG done in office -  ventricular rate 77, PR  - 210, QRS - 90, QTc - 430. No ST elevation or depression, consistent with previous EKG.  - Refer to cardiology for evaluation.   Orders: -     Ambulatory referral to Cardiology -     EKG 12-Lead   Meds ordered this encounter  Medications   metFORMIN (GLUMETZA) 1000 MG (MOD) 24 hr tablet    Sig: Take 1 tablet (1,000 mg total) by mouth daily with breakfast.    Dispense:  30 tablet    Refill:  2   Semaglutide,0.25 or 0.5MG /DOS, (OZEMPIC, 0.25 OR 0.5 MG/DOSE,) 2 MG/3ML SOPN    Sig: Inject 0.25 mg into the skin once a week.    Orders Placed This Encounter  Procedures   Comprehensive metabolic panel   Hemoglobin A1c   Lipid Panel   AMB Referral VBCI Care Management   Ambulatory referral to Cardiology   EKG 12-Lead     Follow-up: Return in about 3 months (around 10/15/2023).  An After Visit Summary was printed and given to the patient.  Lajuana Matte, FNP [facility] [phone]

## 2023-07-15 NOTE — Assessment & Plan Note (Signed)
 Above goal of  LDL>60 LDL at 149 mg/dL, above target for diabetics. Previously ran out of medication. - Continue Lipitor 40 mg by mouth once daily @ bedtime. - Labs drawn, Await labs/testing for assessment and recommendations

## 2023-07-15 NOTE — Assessment & Plan Note (Signed)
 Uncontrolled with A1c of 10.4%. Symptoms include polydipsia, polyuria, nausea, and headaches. Prefers to avoid insulin. Discussed semaglutide for glucose control and symptom relief. Pharmacist Gerilyn Pilgrim to assist with patient assistance application. - Increase metformin ER to 1000 mg once daily. - Initiate Ozempic 0.25 mg SQ once weekly for four weeks, then increase to 0.5 mg. - Provide one-month sample of Ozempic, educated on side effects and administration - FU in 1 week to meet with pharmacist for patient assistance - Order labs to reassess A1c and other parameters.

## 2023-07-16 ENCOUNTER — Telehealth: Payer: Self-pay | Admitting: Family Medicine

## 2023-07-16 ENCOUNTER — Telehealth: Payer: Self-pay

## 2023-07-16 NOTE — Telephone Encounter (Signed)
 Copied from CRM [id number]. Topic: Clinical - Medication Question >> Jul 16, 2023 11:44 AM Marland Kitchen D wrote: [facility] to get a prescription changed the Metformin hoping to get it changed so that his copay will be cheaper for him and he pharmacy Call back- [phone].

## 2023-07-16 NOTE — Telephone Encounter (Signed)
 Called patient to let him know about the diabetes education coming up April 9th. Patient stated he would need to check his schedule and see if he is available. He stated he would call us back and let us know.

## 2023-07-17 LAB — COMPREHENSIVE METABOLIC PANEL WITH GFR
ALT: 21 IU/L (ref 0–44)
AST: 15 IU/L (ref 0–40)
Albumin: 4.5 g/dL (ref 3.8–4.9)
Alkaline Phosphatase: 113 IU/L (ref 44–121)
BUN/Creatinine Ratio: 9 (ref 9–20)
BUN: 10 mg/dL (ref 6–24)
Bilirubin Total: 0.4 mg/dL (ref 0.0–1.2)
CO2: 23 mmol/L (ref 20–29)
Calcium: 9.4 mg/dL (ref 8.7–10.2)
Chloride: 101 mmol/L (ref 96–106)
Creatinine, Ser: 1.07 mg/dL (ref 0.76–1.27)
Globulin, Total: 3 g/dL (ref 1.5–4.5)
Glucose: 291 mg/dL — ABNORMAL HIGH (ref 70–99)
Potassium: 4.2 mmol/L (ref 3.5–5.2)
Sodium: 137 mmol/L (ref 134–144)
Total Protein: 7.5 g/dL (ref 6.0–8.5)
eGFR: 80 mL/min/{1.73_m2} (ref >59)

## 2023-07-17 LAB — LIPID PANEL
Chol/HDL Ratio: 2.2 ratio (ref 0.0–5.0)
Cholesterol, Total: 172 mg/dL (ref 100–199)
HDL: 79 mg/dL (ref >39)
LDL Chol Calc (NIH): 80 mg/dL (ref 0–99)
Triglycerides: 69 mg/dL (ref 0–149)
VLDL Cholesterol Cal: 13 mg/dL (ref 5–40)

## 2023-07-17 LAB — HEMOGLOBIN A1C
Est. average glucose Bld gHb Est-mCnc: 255 mg/dL
Hgb A1c MFr Bld: 10.5 % — ABNORMAL HIGH (ref 4.8–5.6)

## 2023-07-23 ENCOUNTER — Ambulatory Visit (INDEPENDENT_AMBULATORY_CARE_PROVIDER_SITE_OTHER): Payer: Self-pay

## 2023-07-23 DIAGNOSIS — E1165 Type 2 diabetes mellitus with hyperglycemia: Secondary | ICD-10-CM

## 2023-07-23 NOTE — Progress Notes (Signed)
   07/23/2023 Name: Alejandro Zimmerman MRN: [id number] DOB: 06-18-1965  No chief complaint on file.   [name] is a 58 y.o. year old male who was referred for medication management by their primary care provider, Alejandro Crigler, FNP. They presented for a face to face visit today.   They were referred to the pharmacist by their PCP for assistance in managing diabetes and medication access    Subjective:  Care Team: Primary Care Provider: Renne Crigler, FNP ; Next Scheduled Visit:    Medication Access/Adherence  Current Pharmacy:  [facility] - [place], [place] - [address] [place] [address] Phone: [phone] Fax: [phone]   Patient reports affordability concerns with their medications: Yes  Patient reports access/transportation concerns to their pharmacy: No  Patient reports adherence concerns with their medications:  No     Diabetes:  Current medications:  Medications tried in the past:   Current glucose readings: last night 203, 202 Using BG meter; testing 2 times daily  Patient denies hypoglycemic s/sx including dizziness, shakiness, sweating. Patient denies hyperglycemic symptoms including polyuria, polydipsia, polyphagia, nocturia, neuropathy, blurred vision.  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  Current medication access support: discounted drugs through [employer]  Medication Management:  Current adherence strategy:   Patient reports Good adherence to medications  Patient reports the following barriers to adherence: n/a other than cost   Recent fill dates:    Objective:  Lab Results  Component Value Date   HGBA1C 10.5 (H) 07/15/2023    Lab Results  Component Value Date   CREATININE 1.07 07/15/2023   BUN 10 07/15/2023   NA  137 07/15/2023   K 4.2 07/15/2023   CL 101 07/15/2023   CO2 23 07/15/2023    Lab Results  Component Value Date   CHOL 172 07/15/2023   HDL 79 07/15/2023   LDLCALC 80 07/15/2023   TRIG 69 07/15/2023   CHOLHDL 2.2 07/15/2023    Medications Reviewed Today   Medications were not reviewed in this encounter     Assessment/Plan:   Diabetes: - Currently uncontrolled - Reviewed Ermis term cardiovascular and renal outcomes of uncontrolled blood sugar - Reviewed goal A1c, goal fasting, and goal 2 hour post prandial glucose - Reviewed dietary modifications including carb restrictions, minimizing grits, bread, crackers.  - Reviewed lifestyle modifications including: increasing exercise on stationary bike  - Recommend to check glucose twice daily - Meets financial criteria for ozempic patient assistance program through [employer]. Will collaborate with provider, CPhT, and patient to pursue assistance.    Medication Management: - Currently strategy sufficient to maintain appropriate adherence to prescribed medication regimen - Suggested use of weekly pill box to organize medications - Created list of medication, indication, and administration time. Provided to patient   Follow Up Plan: PAP application completed given for prescriber signature. will contact patient 6 weeks to check on ozempic/DM management.  Alejandro Zimmerman, PharmD, BCGP Clinical Pharmacist  [phone]

## 2023-08-21 ENCOUNTER — Other Ambulatory Visit: Payer: Self-pay | Admitting: Family Medicine

## 2023-08-21 DIAGNOSIS — E1165 Type 2 diabetes mellitus with hyperglycemia: Secondary | ICD-10-CM

## 2023-09-03 ENCOUNTER — Other Ambulatory Visit (INDEPENDENT_AMBULATORY_CARE_PROVIDER_SITE_OTHER): Payer: Self-pay

## 2023-09-03 ENCOUNTER — Telehealth: Payer: Self-pay

## 2023-09-03 DIAGNOSIS — E1165 Type 2 diabetes mellitus with hyperglycemia: Secondary | ICD-10-CM

## 2023-09-03 NOTE — Telephone Encounter (Signed)
 Ozempic  patient assistance for located in office.

## 2023-09-03 NOTE — Telephone Encounter (Signed)
 PAP: Application for Ozempic has been submitted to [employer], via fax

## 2023-09-03 NOTE — Progress Notes (Signed)
 09/03/2023 Name: Alejandro Zimmerman MRN: [id number] DOB: August 09, 1965  No chief complaint on file.   Alejandro Zimmerman is a 58 y.o. year old male who was referred for medication management by their primary care provider, Janece Means, FNP. They presented for a face to face visit today.   They were referred to the pharmacist by their PCP for assistance in managing diabetes and medication access    Subjective:  Care Team: Primary Care Provider: Janece Means, FNP ; Next Scheduled Visit:    Medication Access/Adherence  Current Pharmacy:  [facility] - [place], [place] - [address] [place] [address] Phone: [phone] Fax: [phone]   Patient reports affordability concerns with their medications: Yes  Patient reports access/transportation concerns to their pharmacy: No  Patient reports adherence concerns with their medications:  No     Diabetes:  Current medications: pioglitazone , ozempic , metformin  1000 mg daily   Medications tried in the past:   Current glucose readings: around 200 couple hours after eating Using BG meter; testing 2 times daily  Patient denies hypoglycemic s/sx including dizziness, shakiness, sweating. Patient denies hyperglycemic symptoms including polyuria, polydipsia, polyphagia, nocturia, neuropathy, blurred vision.  Current meal patterns:  - Breakfast: eggs, bacon, grits. Stays away from bread.  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  Current medication access support: discounted drugs through [employer]   Medication Management:  Current adherence strategy:   Patient reports Good adherence to medications  Patient reports the following barriers to adherence: n/a other than cost   Recent fill dates:    Objective:  Lab Results  Component Value Date   HGBA1C 10.5 (H) 07/15/2023     Lab Results  Component Value Date   CREATININE 1.07 07/15/2023   BUN 10 07/15/2023   NA 137 07/15/2023   K 4.2 07/15/2023   CL 101 07/15/2023   CO2 23 07/15/2023    Lab Results  Component Value Date   CHOL 172 07/15/2023   HDL 79 07/15/2023   LDLCALC 80 07/15/2023   TRIG 69 07/15/2023   CHOLHDL 2.2 07/15/2023    Medications Reviewed Today     Reviewed by Rolando Cliche, [facility] (Pharmacist) on 09/03/23 at [phone]  Med List Status: <None>   Medication Order Taking? Sig Documenting Provider Last Dose Status Informant  acidophilus (RISAQUAD) CAPS capsule [id number] No Take 1 capsule by mouth daily. [provider] Taking Active   atorvastatin  (LIPITOR) 40 MG tablet [id number] No Take 1 tablet (40 mg total) by mouth daily. Janece Means, FNP Taking Expired 07/30/23 2359   Berberine Chloride 500 MG CAPS [id number] No Take 2 each by mouth daily. [provider] Taking Active   buPROPion  (WELLBUTRIN  XL) 150 MG 24 hr tablet [id number] No Take 1 tablet (150 mg total) by mouth daily.  Patient not taking: Reported on 07/15/2023   Janece Means, FNP Not Taking Active            Med Note (TAYLOR, ANGELA K   Tue Jul 15, 2023  4:13 PM) Patient states makes him see double  lisinopril  (ZESTRIL ) 40 MG tablet [id number] No Take 1 tablet (40 mg total) by mouth daily. Janece Means, FNP Taking Expired 07/30/23 2359   metFORMIN  (GLUMETZA ) 1000 MG (MOD) 24 hr tablet [id number]  Take 1  tablet (1,000 mg total) by mouth daily with breakfast. Janece Means, FNP  Active   Multiple Vitamins-Minerals (MULTIVITAMIN WITH MINERALS) tablet [id number] No Take 1 tablet by mouth daily. [provider] Taking Active   phentermine  37.5 MG capsule [id number] No Take 1 capsule (37.5 mg total) by mouth every morning. Janece Means, FNP Taking Active   pioglitazone  (ACTOS ) 15 MG tablet [id number] No Take 1 tablet (15 mg total) by mouth daily. Janece Means, FNP Taking Active   Semaglutide ,0.25 or  0.5MG /DOS, (OZEMPIC , 0.25 OR 0.5 MG/DOSE,) 2 MG/3ML SOPN [id number]  Inject 0.25 mg into the skin once a week. Janece Means, FNP  Active   tamsulosin  (FLOMAX ) 0.4 MG CAPS capsule [id number] No TAKE 1 CAPSULE BY MOUTH ONCE DAILY AFTERSUPPER Janece Means, FNP Taking Active   Turmeric 08-998 MG CAPS [id number] No Take 1 each by mouth daily. [provider] Taking Active             Assessment/Plan:   Diabetes: - Currently uncontrolled - Reviewed Frid term cardiovascular and renal outcomes of uncontrolled blood sugar - Reviewed goal A1c, goal fasting, and goal 2 hour post prandial glucose - Reviewed dietary modifications including carb restrictions, minimizing grits, bread, crackers.  - Reviewed lifestyle modifications including: increasing exercise on stationary bike  - Recommend to check glucose twice daily - Meets financial criteria for ozempic  patient assistance program through [employer]. Will collaborate with provider, CPhT, and patient to pursue assistance.      Medication Management: - Currently strategy sufficient to maintain appropriate adherence to prescribed medication regimen - Suggested use of weekly pill box to organize medications - Created list of medication, indication, and administration time. Provided to patient   F/u plan: as of 09/03/23, had not received any update on ozempic  pap. Will call office if needing sample. Will have CPhT send application to patient/ will check with PCP to see if office may still have application.   Rolando Cliche, PharmD, BCGP Clinical Pharmacist  [phone]

## 2023-09-03 NOTE — Addendum Note (Signed)
 Addended by: Ahmed Ales on: 09/03/2023 01:22 PM   Modules accepted: Level of Service

## 2023-09-11 ENCOUNTER — Other Ambulatory Visit (HOSPITAL_COMMUNITY): Payer: Self-pay

## 2023-09-11 NOTE — Telephone Encounter (Signed)
 Spoke with [employer]-they will only ship out 4 boxes, corrected quantity on app and refaxed to [employer]

## 2023-09-16 NOTE — Telephone Encounter (Signed)
 PAP: Patient assistance application for Ozempic  has been approved by PAP Companies: [employer] from 09/12/2023 to 09/05/2024. Medication should be delivered to PAP Delivery: Provider's office. For further shipping updates, please contact [employer] at [phone]. Patient ID is: [id number]

## 2023-09-16 NOTE — Progress Notes (Signed)
 Pharmacy Medication Assistance Program Note    09/16/2023  Patient ID: CAROLD EISNER, male   DOB: [birth year], 58 y.o.   MRN: [id number]     09/03/2023  Outreach Medication One  Manufacturer Medication One Novo [employer]isk  [employer] Drugs Ozempic   Dose of Ozempic  0.25  Type of Radiographer, therapeutic Assistance  Name of Prescriber Delford Felling  Date Application Received From Patient 09/03/2023  Application Items Received From Patient Application  Date Application Received From Provider 09/03/2023  Date Application Submitted to Manufacturer 09/03/2023  Method Application Sent to Manufacturer Fax  Patient Assistance Determination Approved  Approval Start Date 09/12/2023  Approval End Date 04/21/2024  Patient Notification Method MyChart     Signature

## 2023-09-17 NOTE — Telephone Encounter (Unsigned)
 Copied from CRM [id number]. Topic: Clinical - Medication Question >> Sep 17, 2023 11:55 AM Danna Duster wrote: Reason for CRM: Patient received a call from the clinic in response to his previous call.  Patient missed the call and calling back in

## 2023-09-25 ENCOUNTER — Telehealth: Payer: Self-pay

## 2023-09-25 ENCOUNTER — Other Ambulatory Visit: Payer: Self-pay | Admitting: Family Medicine

## 2023-09-25 DIAGNOSIS — N400 Enlarged prostate without lower urinary tract symptoms: Secondary | ICD-10-CM

## 2023-09-25 NOTE — Telephone Encounter (Signed)
 Patient notified that he will need to contact [facility] regarding any lab bills.   Copied from CRM [id number]. Topic: General - Billing Inquiry >> Sep 25, 2023  2:25 PM Stanly Early wrote: Reason for CRM: [facility] is stating the patient has unpaid bills and he's telling me he made payments towards the bill. He's called the billing department but was not able to get answers. Patient wold like to know if theres anyone that can help him with this situation.

## 2023-09-30 ENCOUNTER — Telehealth: Payer: Self-pay

## 2023-09-30 NOTE — Telephone Encounter (Signed)
 Patient was informed that we received patient assistance for Ozempic . He will come by the office to pick them up.

## 2023-09-30 NOTE — Telephone Encounter (Signed)
 Patient picked up patient assistance

## 2023-10-08 ENCOUNTER — Other Ambulatory Visit: Payer: Self-pay | Admitting: Family Medicine

## 2023-10-08 DIAGNOSIS — E1165 Type 2 diabetes mellitus with hyperglycemia: Secondary | ICD-10-CM

## 2023-10-16 ENCOUNTER — Ambulatory Visit: Payer: Self-pay | Admitting: Family Medicine

## 2023-10-22 ENCOUNTER — Other Ambulatory Visit: Payer: Self-pay | Admitting: Family Medicine

## 2023-10-22 DIAGNOSIS — N400 Enlarged prostate without lower urinary tract symptoms: Secondary | ICD-10-CM

## 2023-10-29 ENCOUNTER — Other Ambulatory Visit (INDEPENDENT_AMBULATORY_CARE_PROVIDER_SITE_OTHER): Payer: Self-pay

## 2023-10-29 DIAGNOSIS — E1165 Type 2 diabetes mellitus with hyperglycemia: Secondary | ICD-10-CM

## 2023-10-29 NOTE — Addendum Note (Signed)
 Addended by: PANDORA LANG ORN on: 10/29/2023 02:56 PM   Modules accepted: Level of Service

## 2023-10-29 NOTE — Progress Notes (Signed)
 10/29/2023 Name: Alejandro Zimmerman MRN: [id number] DOB: August 11, 1965  No chief complaint on file.   Alejandro Zimmerman is a 58 y.o. year old male who presented for a telephone visit.   They were referred to the pharmacist by their PCP for assistance in managing diabetes.    Subjective:  Care Team: Primary Care Provider: Teressa Harrie HERO, FNP ; Next Scheduled Visit:   Future Appointments  Date Time Provider Department Center  11/25/2023  1:30 PM Pandora Cadet, [facility] None    Medication Access/Adherence  Current Pharmacy:  [facility] - [place], [place] - [phone] [place] [address] Phone: [phone] Fax: [phone]   Patient reports affordability concerns with their medications: Yes  Patient reports access/transportation concerns to their pharmacy: No  Patient reports adherence concerns with their medications:  Yes  - those related to med adherence.   Going on 4 wks of ozempic  0.5mg  per pt report. Might experience some slight stomach discomfort where he has erratic bowel movements for a few days after taking, but finds this tolerable. Is needing to schedule DM f/u with PCP, missed last appt d/t being out of town. States will reach out to schedule, hoping to get in middle of next week.   Diabetes:  Current glucose readings: has not checked recently, in 190s when he checked sometime last week.  Using glucose meter; testing at no specified time.   Patient denies hypoglycemic s/sx including dizziness, shakiness, sweating. Patient denies hyperglycemic symptoms including polyuria, polydipsia, polyphagia, nocturia, neuropathy, blurred vision.  Current medication access support: has been approved for novo nordisk pap for ozempic  0.5mg  injection weekly. Approved through June of 2026.    Objective:  Lab Results  Component Value Date   HGBA1C 10.5 (H) 07/15/2023    Lab Results  Component Value Date   CREATININE 1.07 07/15/2023   BUN 10 07/15/2023   NA 137  07/15/2023   K 4.2 07/15/2023   CL 101 07/15/2023   CO2 23 07/15/2023    Lab Results  Component Value Date   CHOL 172 07/15/2023   HDL 79 07/15/2023   LDLCALC 80 07/15/2023   TRIG 69 07/15/2023   CHOLHDL 2.2 07/15/2023    Medications Reviewed Today     Reviewed by Pandora Cadet, [facility] (Pharmacist) on 10/29/23 at 1315  Med List Status: <None>   Medication Order Taking? Sig Documenting Provider Last Dose Status Informant  acidophilus (RISAQUAD) CAPS capsule [id number]  Take 1 capsule by mouth daily. [provider]  Active   atorvastatin  (LIPITOR) 40 MG tablet [id number]  Take 1 tablet (40 mg total) by mouth daily. Teressa Harrie HERO, FNP  Expired 07/30/23 2359   Berberine Chloride 500 MG CAPS [id number]  Take 2 each by mouth daily. [provider]  Active   buPROPion  (WELLBUTRIN  XL) 150 MG 24 hr tablet [id number]  Take 1 tablet (150 mg total) by mouth daily.  Patient not taking: Reported on 07/15/2023   Teressa Harrie HERO, FNP  Active            Med Note BASILIO, ANGELA K   Tue Jul 15, 2023  4:13 PM) Patient states makes him see double  lisinopril  (ZESTRIL ) 40 MG tablet [id number]  Take 1 tablet (40 mg total) by mouth daily. Teressa Harrie HERO, FNP  Expired 07/30/23 2359   metformin  (FORTAMET ) 1000 MG (OSM) 24 hr tablet [id number]  TAKE ONE TABLET BY MOUTH ONCE DAILY WITH BREAKFAST Jacobs,  Harrie HERO, FNP  Active   Multiple Vitamins-Minerals (MULTIVITAMIN WITH MINERALS) tablet [id number]  Take 1 tablet by mouth daily. [provider]  Active   phentermine  37.5 MG capsule [id number]  Take 1 capsule (37.5 mg total) by mouth every morning. Teressa Harrie HERO, FNP  Active   pioglitazone  (ACTOS ) 15 MG tablet [id number]  Take 1 tablet (15 mg total) by mouth daily. Teressa Harrie HERO, FNP  Active   Semaglutide ,0.25 or 0.5MG /DOS, (OZEMPIC , 0.25 OR 0.5 MG/DOSE,) 2 MG/3ML SOPN [id number] Yes Inject 0.25 mg into the skin once a week.  Patient taking differently: Inject 0.5 mg into the skin once a week.    Teressa Harrie HERO, FNP  Active   tamsulosin  (FLOMAX ) 0.4 MG CAPS capsule [id number]  TAKE 1 CAPSULE BY MOUTH ONCE DAILY AFTER SUPPER Teressa Harrie HERO, FNP  Active   Turmeric 08-998 MG CAPS [id number]  Take 1 each by mouth daily. [provider]  Active               Assessment/Plan:   Diabetes: - Currently uncontrolled - Reviewed [place] term cardiovascular and renal outcomes of uncontrolled blood sugar - Reviewed goal A1c, goal fasting, and goal 2 hour post prandial glucose - reviewed recent approval of ozempic  pap and possible next steps in titration - d/t lack of home readings and A1c, and missed PCP f/u; can reevaluate in 4 wks. Encouraged patient to contact front office to schedule his f/u.   Follow Up Plan: 4wk telephone  Lang Sieve, PharmD, BCGP Clinical Pharmacist  [phone]

## 2023-11-25 ENCOUNTER — Other Ambulatory Visit: Payer: Self-pay

## 2023-11-25 NOTE — Progress Notes (Signed)
   11/25/2023 Name: Alejandro Zimmerman MRN: [id number] DOB: 1965/05/30  No chief complaint on file.   Alejandro Zimmerman is a 57 y.o. year old male who presented for a telephone visit.   They were referred to the pharmacist by their PCP for assistance in managing diabetes.    Subjective:  Care Team: Primary Care Provider: Teressa Harrie HERO, FNP ; Next Scheduled Visit:   No future appointments.   Medication Access/Adherence  Current Pharmacy:  [facility] - [place], [place] - [address] [place] [address] Phone: [phone] Fax: [phone]   Patient reports affordability concerns with their medications: Yes  Patient reports access/transportation concerns to their pharmacy: No  Patient reports adherence concerns with their medications:  Yes  - those related to med adherence.   Going on 4 wks of ozempic  0.5mg  per pt report. Might experience some slight stomach discomfort where he has erratic bowel movements for a few days after taking, but finds this tolerable. Is needing to schedule DM f/u with PCP, missed last appt d/t being out of town. States will reach out to schedule, hoping to get in middle of next week.   Diabetes:  Current glucose readings: has not checked recently, in 190s when he checked sometime last week.  Using glucose meter; testing at no specified time.   Patient denies hypoglycemic s/sx including dizziness, shakiness, sweating. Patient denies hyperglycemic symptoms including polyuria, polydipsia, polyphagia, nocturia, neuropathy, blurred vision.  Current medication access support: has been approved for novo nordisk pap for ozempic  0.5mg  injection weekly. Approved through June of 2026.    Objective:  Lab Results  Component Value Date   HGBA1C 10.5 (H) 07/15/2023    Lab Results  Component Value Date   CREATININE 1.07 07/15/2023   BUN 10 07/15/2023   NA 137 07/15/2023   K 4.2 07/15/2023   CL 101 07/15/2023   CO2 23 07/15/2023    Lab  Results  Component Value Date   CHOL 172 07/15/2023   HDL 79 07/15/2023   LDLCALC 80 07/15/2023   TRIG 69 07/15/2023   CHOLHDL 2.2 07/15/2023    Medications Reviewed Today   Medications were not reviewed in this encounter       Assessment/Plan:   Diabetes: - Currently uncontrolled - Reviewed [place] term cardiovascular and renal outcomes of uncontrolled blood sugar - Reviewed goal A1c, goal fasting, and goal 2 hour post prandial glucose - reviewed recent approval of ozempic  pap and possible next steps in titration - d/t lack of home readings and A1c, and missed PCP f/u; can reevaluate in 4 wks. Encouraged patient to contact front office to schedule his f/u.   11/25/23 update: - Spoke with patient briefly - lots of recent travel/busy with work. Is hoping to get into office Friday or Monday if Harrie is available  - recent BG around 190s - Ozempic  is back home so has not taken recently d/t being out of town    Follow Up Plan: plan to f/u 1-2 weeks after bloodwork. Pharmacist to request from office to reach out to patient.   Lang Sieve, PharmD, BCGP Clinical Pharmacist  [phone]

## 2023-11-28 ENCOUNTER — Encounter: Payer: Self-pay | Admitting: Family Medicine

## 2023-11-28 ENCOUNTER — Ambulatory Visit: Admitting: Family Medicine

## 2023-12-16 ENCOUNTER — Telehealth: Payer: Self-pay

## 2023-12-16 NOTE — Telephone Encounter (Signed)
 Completed reorder form for Novo Nordisk for Ozempic  and faxed to Harrie Cedar to review and sign and return to Novo Nordisk at [phone]

## 2023-12-25 ENCOUNTER — Telehealth: Payer: Self-pay

## 2023-12-25 NOTE — Telephone Encounter (Signed)
 Called and left message for patient informing him that we have received his patient assistance for his Ozempic  0.25/0.5 mg and its ready for pick up.   Received Ozempic  0.25/0.5 mg 4 boxes  NDC# [id number]  Lot#[id number]  EXP: 02/19/2026

## 2023-12-29 NOTE — Telephone Encounter (Signed)
 Patient picked up patient assistance

## 2024-04-02 ENCOUNTER — Other Ambulatory Visit: Payer: Self-pay | Admitting: Family Medicine

## 2024-04-02 DIAGNOSIS — F321 Major depressive disorder, single episode, moderate: Secondary | ICD-10-CM

## 2024-04-02 DIAGNOSIS — N400 Enlarged prostate without lower urinary tract symptoms: Secondary | ICD-10-CM

## 2024-04-02 DIAGNOSIS — E1159 Type 2 diabetes mellitus with other circulatory complications: Secondary | ICD-10-CM

## 2024-04-02 DIAGNOSIS — E1165 Type 2 diabetes mellitus with hyperglycemia: Secondary | ICD-10-CM

## 2024-04-02 MED ORDER — LISINOPRIL 40 MG PO TABS: 40.0000 mg | ORAL_TABLET | Freq: Every day | ORAL | 3 refills | Status: AC

## 2024-04-02 MED ORDER — PIOGLITAZONE HCL 15 MG PO TABS: 15.0000 mg | ORAL_TABLET | Freq: Every day | ORAL | 3 refills | Status: AC

## 2024-04-02 MED ORDER — BUPROPION HCL ER (XL) 150 MG PO TB24: 150.0000 mg | ORAL_TABLET | Freq: Every day | ORAL | 3 refills | Status: AC

## 2024-04-02 MED ORDER — TAMSULOSIN HCL 0.4 MG PO CAPS: ORAL_CAPSULE | ORAL | 0 refills | Status: AC

## 2024-05-06 ENCOUNTER — Other Ambulatory Visit: Payer: Self-pay | Admitting: Family Medicine

## 2024-05-06 DIAGNOSIS — E1169 Type 2 diabetes mellitus with other specified complication: Secondary | ICD-10-CM

## 2024-05-21 ENCOUNTER — Telehealth: Payer: Self-pay

## 2024-05-21 NOTE — Telephone Encounter (Signed)
 Do you how the patient can get the refill for ozempic ?  Copied from CRM #[id number]. Topic: Clinical - Medication Question >> May 21, 2024 12:28 PM Selinda RAMAN wrote: Reason for CRM: The patient called in stating he is on the patient assistance program for his Ozempic  and doesn't know what to do concerning refills. Does he need to come in since its been since last March sicne he saw his provider. Please assist patient further

## 2024-05-24 ENCOUNTER — Telehealth: Payer: Self-pay
